# Patient Record
Sex: Female | Born: 2000 | Hispanic: No | Marital: Single | Smoking: Never smoker
Health system: Southern US, Community
[De-identification: ages and names within clinical notes are randomized; demographics above are authoritative.]

## PROBLEM LIST (undated history)

## (undated) DIAGNOSIS — Z789 Other specified health status: Secondary | ICD-10-CM

## (undated) DIAGNOSIS — G43909 Migraine, unspecified, not intractable, without status migrainosus: Secondary | ICD-10-CM

## (undated) HISTORY — DX: Other specified health status: Z78.9

## (undated) HISTORY — PX: WISDOM TOOTH EXTRACTION: SHX21

## (undated) HISTORY — DX: Migraine, unspecified, not intractable, without status migrainosus: G43.909

---
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
## 2018-02-28 ENCOUNTER — Ambulatory Visit (INDEPENDENT_AMBULATORY_CARE_PROVIDER_SITE_OTHER): Payer: Medicaid Other | Admitting: Pediatrics

## 2018-02-28 ENCOUNTER — Encounter: Payer: Self-pay | Admitting: Pediatrics

## 2018-02-28 ENCOUNTER — Other Ambulatory Visit: Payer: Self-pay

## 2018-02-28 VITALS — Temp 97.2°F | Wt 113.4 lb

## 2018-02-28 DIAGNOSIS — J029 Acute pharyngitis, unspecified: Secondary | ICD-10-CM | POA: Diagnosis not present

## 2018-02-28 LAB — POCT RAPID STREP A (OFFICE): RAPID STREP A SCREEN: NEGATIVE

## 2018-02-28 MED ORDER — ACETAMINOPHEN 325 MG PO TABS: 650.0000 mg | ORAL_TABLET | Freq: Four times a day (QID) | ORAL | 0 refills | Status: DC | PRN

## 2018-02-28 MED ORDER — IBUPROFEN 400 MG PO TABS: 400.0000 mg | ORAL_TABLET | Freq: Four times a day (QID) | ORAL | 0 refills | Status: DC | PRN

## 2018-02-28 NOTE — Patient Instructions (Signed)
It was great to see you!  Our plans for today:  - Your sore throat is likely caused by a virus. You can gargle  warm salt water to help with pain. Warm teas and soups as well as honey are also helpful in soothing the throat.  - We are collecting a strep and bacterial culture, we will call you if these are positive. - If you develop fever or worsening of symptoms, come back to be seen. - If you develop difficulty breathing, go to the ED.  Take care and seek immediate care sooner if you develop any concerns.   Dr. Linwood Dibbles   Pharyngitis Pharyngitis is a sore throat (pharynx). There is redness, pain, and swelling of your throat. Follow these instructions at home:  Drink enough fluids to keep your pee (urine) clear or pale yellow.  Only take medicine as told by your doctor. ? You may get sick again if you do not take medicine as told. Finish your medicines, even if you start to feel better. ? Do not take aspirin.  Rest.  Rinse your mouth (gargle) with salt water ( tsp of salt per 1 qt of water) every 1-2 hours. This will help the pain.  If you are not at risk for choking, you can suck on hard candy or sore throat lozenges. Contact a doctor if:  You have large, tender lumps on your neck.  You have a rash.  You cough up green, yellow-brown, or bloody spit. Get help right away if:  You have a stiff neck.  You drool or cannot swallow liquids.  You throw up (vomit) or are not able to keep medicine or liquids down.  You have very bad pain that does not go away with medicine.  You have problems breathing (not from a stuffy nose). This information is not intended to replace advice given to you by your health care provider. Make sure you discuss any questions you have with your health care provider. Document Released: 10/27/2007 Document Revised: 10/16/2015 Document Reviewed: 01/15/2013 Elsevier Interactive Patient Education  2017 [employer].

## 2018-02-28 NOTE — Progress Notes (Signed)
   Subjective:     Elizabeth Harrington, is a 17 y.o. female   History provider by patient No interpreter necessary.  Chief Complaint  Patient presents with  . Sore Throat    UTD x flu and patient defers. c/o painful sore throat x 3 days, unable to eat. unsure of fever status. using ibuprofen.   . Abdominal Pain    tummy pains and vomiting.    HPI:  Patient reports sore throat for 3 days, hurts to swallow. States she started vomiting started last night, no hematemesis. Also reports some associated abdominal pain which is now resolved. She took ibuprofen which didn't help her sore throat. No rhinorrhea, headache, rashes.  Yesterday had some ear congestion. Little sister was sick 2-3 weeks ago with viral URI. She has minimal cough. No fevers noted. Stooling and voiding normally. No concern for STIs. No difficulty breathing or drooling. Vaccinations UTD, has not yet gotten flu vaccine.  Review of Systems - per HPI   Patient's history was reviewed and updated as appropriate: current medications, past medical history and problem list.     Objective:     Temp (!) 97.2 F (36.2 C) (Temporal)   Wt 113 lb 6.4 oz (51.4 kg)   Physical Exam  Constitutional: She is oriented to person, place, and time. She appears well-developed and well-nourished. She does not appear ill.  HENT:  Right Ear: Tympanic membrane normal.  Left Ear: Tympanic membrane normal.  Mouth/Throat: Uvula is midline. Posterior oropharyngeal erythema present. No oropharyngeal exudate. No tonsillar exudate.  Neck: Normal range of motion.  Cardiovascular: Normal rate, regular rhythm and normal heart sounds.  No murmur heard. Pulmonary/Chest: Effort normal and breath sounds normal. No respiratory distress.  Abdominal: Soft. Bowel sounds are normal. There is no tenderness.  Lymphadenopathy:    She has no cervical adenopathy.  Neurological: She is alert and oriented to person, place, and time.  Skin: Skin is warm and dry.  No rash noted. No erythema.      Assessment & Plan:   Viral Pharyngitis Symptoms and exam most consistent with viral illness, especially given sick contact, lack of fever and lymphadenopathy. Rapid strep negative, will send for culture along with urine GC/CT. Will call with abnormal results - patient cell # [phone]. Supportive care and return precautions reviewed.  No follow-ups on file.  Ellwood Dense, DO  I reviewed with the resident the medical history and the resident's findings on physical examination. I discussed with the resident the patient's diagnosis and concur with the treatment plan as documented in the resident's note.  Henrietta Hoover, MD                 02/28/2018, 4:15 PM

## 2018-02-28 NOTE — Progress Notes (Signed)
Patients cell for lab results: [id number].

## 2018-03-01 LAB — C. TRACHOMATIS/N. GONORRHOEAE RNA
C. trachomatis RNA, TMA: NOT DETECTED
N. gonorrhoeae RNA, TMA: NOT DETECTED

## 2018-03-02 LAB — CULTURE, GROUP A STREP
MICRO NUMBER: 91208300
SPECIMEN QUALITY:: ADEQUATE

## 2018-07-03 ENCOUNTER — Encounter (HOSPITAL_COMMUNITY): Payer: Self-pay | Admitting: Emergency Medicine

## 2018-07-03 ENCOUNTER — Other Ambulatory Visit: Payer: Self-pay

## 2018-07-03 ENCOUNTER — Observation Stay (HOSPITAL_COMMUNITY)
Admission: EM | Admit: 2018-07-03 | Discharge: 2018-07-04 | Disposition: A | Discharge status: Home / Self Care | Payer: Medicaid Other | Attending: Internal Medicine | Admitting: Internal Medicine

## 2018-07-03 ENCOUNTER — Emergency Department (HOSPITAL_COMMUNITY): Payer: Medicaid Other

## 2018-07-03 DIAGNOSIS — R69 Illness, unspecified: Secondary | ICD-10-CM

## 2018-07-03 DIAGNOSIS — R6889 Other general symptoms and signs: Secondary | ICD-10-CM | POA: Diagnosis not present

## 2018-07-03 DIAGNOSIS — J111 Influenza due to unidentified influenza virus with other respiratory manifestations: Secondary | ICD-10-CM

## 2018-07-03 DIAGNOSIS — R000 Tachycardia, unspecified: Secondary | ICD-10-CM | POA: Diagnosis present

## 2018-07-03 DIAGNOSIS — Z791 Long term (current) use of non-steroidal anti-inflammatories (NSAID): Secondary | ICD-10-CM | POA: Insufficient documentation

## 2018-07-03 DIAGNOSIS — J101 Influenza due to other identified influenza virus with other respiratory manifestations: Secondary | ICD-10-CM | POA: Diagnosis not present

## 2018-07-03 DIAGNOSIS — R112 Nausea with vomiting, unspecified: Secondary | ICD-10-CM | POA: Insufficient documentation

## 2018-07-03 DIAGNOSIS — R05 Cough: Secondary | ICD-10-CM | POA: Diagnosis not present

## 2018-07-03 LAB — I-STAT BETA HCG BLOOD, ED (MC, WL, AP ONLY): I-stat hCG, quantitative: <5 m[IU]/mL (ref <5)

## 2018-07-03 LAB — URINALYSIS, ROUTINE W REFLEX MICROSCOPIC
Bilirubin Urine: NEGATIVE
GLUCOSE, UA: NEGATIVE mg/dL
Ketones, ur: 80 mg/dL — AB
Leukocytes, UA: NEGATIVE
Nitrite: NEGATIVE
Protein, ur: 30 mg/dL — AB
Specific Gravity, Urine: 1.017 (ref 1.005–1.030)
pH: 6 (ref 5.0–8.0)

## 2018-07-03 LAB — CBC
HCT: 47.4 % — ABNORMAL HIGH (ref 36.0–46.0)
Hemoglobin: 16.1 g/dL — ABNORMAL HIGH (ref 12.0–15.0)
MCH: 31.7 pg (ref 26.0–34.0)
MCHC: 34 g/dL (ref 30.0–36.0)
MCV: 93.3 fL (ref 80.0–100.0)
Platelets: 253 10*3/uL (ref 150–400)
RBC: 5.08 MIL/uL (ref 3.87–5.11)
RDW: 11.9 % (ref 11.5–15.5)
WBC: 8.5 10*3/uL (ref 4.0–10.5)
nRBC: 0 % (ref 0.0–0.2)

## 2018-07-03 LAB — COMPREHENSIVE METABOLIC PANEL
ALT: 20 U/L (ref 0–44)
AST: 21 U/L (ref 15–41)
Albumin: 4.1 g/dL (ref 3.5–5.0)
Alkaline Phosphatase: 63 U/L (ref 38–126)
Anion gap: 11 (ref 5–15)
BUN: 11 mg/dL (ref 6–20)
CO2: 22 mmol/L (ref 22–32)
Calcium: 8.7 mg/dL — ABNORMAL LOW (ref 8.9–10.3)
Chloride: 109 mmol/L (ref 98–111)
Creatinine, Ser: 0.77 mg/dL (ref 0.44–1.00)
GFR calc Af Amer: >60 mL/min (ref >60)
GFR calc non Af Amer: >60 mL/min (ref >60)
Glucose, Bld: 92 mg/dL (ref 70–99)
Potassium: 4.2 mmol/L (ref 3.5–5.1)
Sodium: 142 mmol/L (ref 135–145)
Total Bilirubin: 1.3 mg/dL — ABNORMAL HIGH (ref 0.3–1.2)
Total Protein: 7.3 g/dL (ref 6.5–8.1)

## 2018-07-03 LAB — RESPIRATORY PANEL BY PCR
Adenovirus: NOT DETECTED
BORDETELLA PERTUSSIS-RVPCR: NOT DETECTED
Chlamydophila pneumoniae: NOT DETECTED
Coronavirus 229E: NOT DETECTED
Coronavirus HKU1: NOT DETECTED
Coronavirus NL63: NOT DETECTED
Coronavirus OC43: NOT DETECTED
Influenza A H1 2009: DETECTED — AB
Influenza B: NOT DETECTED
METAPNEUMOVIRUS-RVPPCR: NOT DETECTED
Mycoplasma pneumoniae: NOT DETECTED
PARAINFLUENZA VIRUS 2-RVPPCR: NOT DETECTED
Parainfluenza Virus 1: NOT DETECTED
Parainfluenza Virus 3: NOT DETECTED
Parainfluenza Virus 4: NOT DETECTED
Respiratory Syncytial Virus: NOT DETECTED
Rhinovirus / Enterovirus: NOT DETECTED

## 2018-07-03 LAB — RAPID URINE DRUG SCREEN, HOSP PERFORMED
Amphetamines: NOT DETECTED
BENZODIAZEPINES: NOT DETECTED
Barbiturates: NOT DETECTED
Cocaine: NOT DETECTED
Opiates: NOT DETECTED
Tetrahydrocannabinol: NOT DETECTED

## 2018-07-03 LAB — PROCALCITONIN: Procalcitonin: <0.1 ng/mL

## 2018-07-03 LAB — INFLUENZA PANEL BY PCR (TYPE A & B)
INFLAPCR: POSITIVE — AB
Influenza B By PCR: NEGATIVE

## 2018-07-03 LAB — GROUP A STREP BY PCR: Group A Strep by PCR: NOT DETECTED

## 2018-07-03 LAB — LACTIC ACID, PLASMA: Lactic Acid, Venous: 1 mmol/L (ref 0.5–1.9)

## 2018-07-03 LAB — TSH: TSH: 0.399 u[IU]/mL (ref 0.350–4.500)

## 2018-07-03 LAB — LIPASE, BLOOD: Lipase: 28 U/L (ref 11–51)

## 2018-07-03 MED ORDER — SODIUM CHLORIDE 0.9 % IV BOLUS
1000.0000 mL | Freq: Once | INTRAVENOUS | Status: AC
  Administered 2018-07-03: 1000 mL via INTRAVENOUS

## 2018-07-03 MED ORDER — IBUPROFEN 200 MG PO TABS
400.0000 mg | ORAL_TABLET | Freq: Four times a day (QID) | ORAL | Status: DC | PRN
  Administered 2018-07-03: 400 mg via ORAL
  Filled 2018-07-03: qty 2

## 2018-07-03 MED ORDER — BENZONATATE 100 MG PO CAPS
100.0000 mg | ORAL_CAPSULE | Freq: Three times a day (TID) | ORAL | Status: DC | PRN
  Administered 2018-07-04: 100 mg via ORAL
  Filled 2018-07-03: qty 1

## 2018-07-03 MED ORDER — SUMATRIPTAN SUCCINATE 50 MG PO TABS
50.0000 mg | ORAL_TABLET | Freq: Once | ORAL | Status: AC
  Administered 2018-07-03: 50 mg via ORAL
  Filled 2018-07-03: qty 1

## 2018-07-03 MED ORDER — IBUPROFEN 400 MG PO TABS
600.0000 mg | ORAL_TABLET | Freq: Once | ORAL | Status: AC
  Administered 2018-07-03: 600 mg via ORAL
  Filled 2018-07-03: qty 1

## 2018-07-03 MED ORDER — ONDANSETRON HCL 4 MG/2ML IJ SOLN
4.0000 mg | Freq: Four times a day (QID) | INTRAMUSCULAR | Status: DC | PRN
  Administered 2018-07-03: 4 mg via INTRAVENOUS
  Filled 2018-07-03: qty 2

## 2018-07-03 MED ORDER — ENOXAPARIN SODIUM 40 MG/0.4ML ~~LOC~~ SOLN: 40.0000 mg | SUBCUTANEOUS | Status: DC

## 2018-07-03 MED ORDER — SODIUM CHLORIDE 0.9 % IV SOLN
INTRAVENOUS | Status: DC
  Administered 2018-07-03 – 2018-07-04 (×2): via INTRAVENOUS

## 2018-07-03 MED ORDER — ENOXAPARIN SODIUM 40 MG/0.4ML ~~LOC~~ SOLN
40.0000 mg | SUBCUTANEOUS | Status: DC
  Filled 2018-07-03: qty 0.4

## 2018-07-03 MED ORDER — OSELTAMIVIR PHOSPHATE 75 MG PO CAPS
75.0000 mg | ORAL_CAPSULE | Freq: Two times a day (BID) | ORAL | Status: DC
  Administered 2018-07-03 – 2018-07-04 (×2): 75 mg via ORAL
  Filled 2018-07-03 (×2): qty 1

## 2018-07-03 MED ORDER — OSELTAMIVIR PHOSPHATE 75 MG PO CAPS
75.0000 mg | ORAL_CAPSULE | Freq: Once | ORAL | Status: AC
  Administered 2018-07-03: 75 mg via ORAL
  Filled 2018-07-03: qty 1

## 2018-07-03 MED ORDER — FLUTICASONE PROPIONATE 50 MCG/ACT NA SUSP
2.0000 | Freq: Every day | NASAL | Status: DC
  Administered 2018-07-04: 2 via NASAL
  Filled 2018-07-03: qty 16

## 2018-07-03 MED ORDER — ONDANSETRON HCL 4 MG/2ML IJ SOLN
4.0000 mg | Freq: Once | INTRAMUSCULAR | Status: AC | PRN
  Administered 2018-07-03: 4 mg via INTRAVENOUS
  Filled 2018-07-03: qty 2

## 2018-07-03 MED ORDER — RAMELTEON 8 MG PO TABS
8.0000 mg | ORAL_TABLET | Freq: Once | ORAL | Status: AC
  Administered 2018-07-03: 8 mg via ORAL
  Filled 2018-07-03: qty 1

## 2018-07-03 MED ORDER — GUAIFENESIN ER 600 MG PO TB12
600.0000 mg | ORAL_TABLET | Freq: Two times a day (BID) | ORAL | Status: DC
  Administered 2018-07-04: 600 mg via ORAL
  Filled 2018-07-03 (×2): qty 1

## 2018-07-03 MED ORDER — ACETAMINOPHEN 325 MG PO TABS
650.0000 mg | ORAL_TABLET | Freq: Four times a day (QID) | ORAL | Status: DC | PRN
  Administered 2018-07-03 – 2018-07-04 (×2): 650 mg via ORAL
  Filled 2018-07-03 (×2): qty 2

## 2018-07-03 NOTE — ED Notes (Signed)
Report attempted 

## 2018-07-03 NOTE — H&P (Addendum)
History and Physical  Elizabeth Harrington FSE:[id number] DOB: 03/11/2001 DOA: 07/03/2018  Referring physician: EDP PCP: Jonetta Osgood, MD   Chief Complaint: Flulike symptoms, cough, nausea, vomiting  HPI: Elizabeth Harrington is a 18 y.o. female  No prior medical history presented to the emergency room with above complaints.Patient reports sore throat, nasal congestion, dry cough x 2 days and nonbilious, nonbloody emesis x10 times.she has not been able to keep anything down for the last two days. She vomited twice today, last when she arrived to the ED. She denies sick contact, she reports similar symptom when she had her last period. She denies chest pain,  ab pain, no flank pain, no dysuria. No diarrhea.  ED course: She has mild temperature 100.1 on presentation, she has persistent sinus tachycardia and borderline bp after 2 L's of normal saline bolus. Blood pressure stable, no hypoxia.  Chest x-ray no acute findings, strep throat negative. Pregnancy test negative,  WBC 8.5, creatinine 0.77, UA + blood ( currently having period),  flu test pending collection, she is given one dose of tamiflu, due to persistent tachycardia, not able to tolerate oral intake, hospitalist called to further manage the patient.   Review of Systems:  Detail per HPI, Review of systems are otherwise negative  History reviewed. No pertinent past medical history. Past Surgical History:  Procedure Laterality Date  . WISDOM TOOTH EXTRACTION     Social History:  reports that she has never smoked. She has never used smokeless tobacco. She reports that she does not drink alcohol or use drugs. Patient lives at home & is able to participate in activities of daily living independently   No Known Allergies  No family history on file.    Prior to Admission medications   Medication Sig Start Date End Date Taking? Authorizing Provider  ibuprofen (ADVIL,MOTRIN) 400 MG tablet Take 1 tablet (400 mg total) by mouth every  6 (six) hours as needed. Patient taking differently: Take 600 mg by mouth every 6 (six) hours as needed.  02/28/18  Yes Rumball, Jill Side, DO  OVER THE COUNTER MEDICATION Take 20 mLs by mouth as needed (coug). Robitussin max strength cough and chest congestion DM   Yes [provider]  UNABLE TO FIND Take 2 tablets by mouth once. Med Name:Contac.   Yes [provider]  acetaminophen (TYLENOL) 325 MG tablet Take 2 tablets (650 mg total) by mouth every 6 (six) hours as needed for mild pain or fever. Patient not taking: Reported on 07/03/2018 02/28/18   Ellwood Dense, DO    Physical Exam: BP (!) 92/40   Pulse (!) 132   Temp 99.4 F (37.4 C) (Oral)   Resp 19   Ht 4\' 11"  (1.499 m)   Wt 49.9 kg   LMP 07/03/2018   SpO2 100%   BMI 22.22 kg/m   General:  NAD Eyes: PERRL ENT: unremarkable Neck: supple, no JVD Cardiovascular: tachycardia Respiratory: CTABL Abdomen: soft/ND/ND, positive bowel sounds Skin: no rash Musculoskeletal:  No edema Psychiatric: calm/cooperative Neurologic: no focal findings            Labs on Admission:  Basic Metabolic Panel: Recent Labs  Lab 07/03/18 1023  NA 142  K 4.2  CL 109  CO2 22  GLUCOSE 92  BUN 11  CREATININE 0.77  CALCIUM 8.7*   Liver Function Tests: Recent Labs  Lab 07/03/18 1023  AST 21  ALT 20  ALKPHOS 63  BILITOT 1.3*  PROT 7.3  ALBUMIN 4.1   Recent Labs  Lab 07/03/18 1023  LIPASE 28   No results for input(s): AMMONIA in the last 168 hours. CBC: Recent Labs  Lab 07/03/18 0949  WBC 8.5  HGB 16.1*  HCT 47.4*  MCV 93.3  PLT 253   Cardiac Enzymes: No results for input(s): CKTOTAL, CKMB, CKMBINDEX, TROPONINI in the last 168 hours.  BNP (last 3 results) No results for input(s): BNP in the last 8760 hours.  ProBNP (last 3 results) No results for input(s): PROBNP in the last 8760 hours.  CBG: No results for input(s): GLUCAP in the last 168 hours.  Radiological Exams on Admission: Dg Chest 2  View  Result Date: 07/03/2018 CLINICAL DATA:  Cough and emesis EXAM: CHEST - 2 VIEW COMPARISON:  October 31, 2016 FINDINGS: The lungs are clear. The heart size and pulmonary vascularity are normal. No adenopathy. No bone lesions. IMPRESSION: No edema or consolidation. Electronically Signed   By: Bretta BangWilliam  Woodruff III M.D.   On: 07/03/2018 11:57      Assessment/Plan Present on Admission: . Flu-like symptoms   Flu like symptom with significant dehydration, tachycardia, n/v -flu swab pending, will add on HIV, blood culture, procalcitonin, lactic acid -continue hydration, supportive care, full liquid diet, advance diet as tolerated  DVT prophylaxis: lovenox  Consultants: none  Code Status: full   Family Communication:  Patient   Disposition Plan: med tele obs, likely home tomorrow  Time spent: 50mins  Albertine GratesFang Airiel Oblinger MD, PhD [facility] Pager [phone] If 7PM-7AM, please contact night-coverage at [email or web address], password [email or web address]

## 2018-07-03 NOTE — ED Notes (Signed)
Pt oob to bathroom with steady gait. 

## 2018-07-03 NOTE — ED Triage Notes (Signed)
Patient reports sore throat x 2 days and emesis onset of yesterday. Patient reports dry cough x 2 days.

## 2018-07-03 NOTE — ED Notes (Signed)
Pt given po fluids and tolerating well. 

## 2018-07-03 NOTE — ED Notes (Signed)
Dr. Jolaine Artist aware of hr.

## 2018-07-03 NOTE — ED Provider Notes (Signed)
[name]   CSN: [id number] Arrival date & time: 07/03/18  [id number]     History   Chief Complaint Chief Complaint  Patient presents with  . Sore Throat  . Vomiting    HPI Elizabeth Harrington is a 18 y.o. female.  Patient is a 18 year old female who presents with a 2-day history of URI symptoms with runny nose congestion and coughing.  She reports her cough is mostly dry.  She also has a sore throat.  She denies any rash.  She has had some associated nausea and vomiting and today has not been able to keep anything down.  She is been trying to use some over-the-counter medicines including Robitussin for the coughing but has not been able keep it down today.  She was noted to be markedly tachycardic in triage.     History reviewed. No pertinent past medical history.  Patient Active Problem List   Diagnosis Date Noted  . Adjustment disorder with depressed mood 11/13/2015    Past Surgical History:  Procedure Laterality Date  . WISDOM TOOTH EXTRACTION       OB History    Gravida  0   Para  0   Term  0   Preterm  0   AB  0   Living        SAB  0   TAB  0   Ectopic  0   Multiple      Live Births               Home Medications    Prior to Admission medications   Medication Sig Start Date End Date Taking? Authorizing Provider  ibuprofen (ADVIL,MOTRIN) 400 MG tablet Take 1 tablet (400 mg total) by mouth every 6 (six) hours as needed. Patient taking differently: Take 600 mg by mouth every 6 (six) hours as needed.  02/28/18  Yes Rumball, Jill SideAlison, DO  OVER THE COUNTER MEDICATION Take 20 mLs by mouth as needed (coug). Robitussin max strength cough and chest congestion DM   Yes [provider]  UNABLE TO FIND Take 2 tablets by mouth once. Med Name:Contac.   Yes [provider]  acetaminophen (TYLENOL) 325 MG tablet Take 2 tablets (650 mg total) by mouth every 6 (six) hours as needed for  mild pain or fever. Patient not taking: Reported on 07/03/2018 02/28/18   Ellwood Denseumball, Alison, DO    Family History No family history on file.  Social History Social History   Tobacco Use  . Smoking status: Never Smoker  . Smokeless tobacco: Never Used  Substance Use Topics  . Alcohol use: No    Alcohol/week: 0.0 standard drinks  . Drug use: Never     Allergies   Patient has no known allergies.   Review of Systems Review of Systems  Constitutional: Positive for appetite change, fatigue and fever. Negative for chills and diaphoresis.  HENT: Positive for congestion, postnasal drip, rhinorrhea, sore throat and trouble swallowing. Negative for sneezing and voice change.   Eyes: Negative.   Respiratory: Positive for cough. Negative for chest tightness and shortness of breath.   Cardiovascular: Negative for chest pain and leg swelling.  Gastrointestinal: Positive for nausea and vomiting. Negative for abdominal pain, blood in stool and diarrhea.  Genitourinary: Negative for difficulty urinating, flank pain, frequency and hematuria.  Musculoskeletal: Negative for arthralgias and back pain.  Skin: Negative for rash.  Neurological: Negative for dizziness, speech difficulty, weakness, numbness and headaches.  Physical Exam Updated Vital Signs BP (!) 92/40   Pulse (!) 132   Temp 99.4 F (37.4 C) (Oral)   Resp 19   Ht 4\' 11"  (1.499 m)   Wt 49.9 kg   LMP 07/03/2018   SpO2 100%   BMI 22.22 kg/m   Physical Exam Constitutional:      Appearance: She is well-developed.  HENT:     Head: Normocephalic and atraumatic.     Right Ear: Tympanic membrane normal.     Left Ear: Tympanic membrane normal.     Nose: No congestion or rhinorrhea.     Mouth/Throat:     Mouth: No oral lesions.     Pharynx: Uvula midline. Posterior oropharyngeal erythema present. No pharyngeal swelling, oropharyngeal exudate or uvula swelling.     Tonsils: Tonsillar abscess present. No tonsillar exudate.      Comments: Mild erythema posterior pharynx without exudates, uvula is midline, no elevation of the tongue Eyes:     Pupils: Pupils are equal, round, and reactive to light.  Neck:     Musculoskeletal: Normal range of motion and neck supple.  Cardiovascular:     Rate and Rhythm: Normal rate and regular rhythm.     Heart sounds: Normal heart sounds.  Pulmonary:     Effort: Pulmonary effort is normal. No respiratory distress.     Breath sounds: Normal breath sounds. No wheezing or rales.  Chest:     Chest wall: No tenderness.  Abdominal:     General: Bowel sounds are normal.     Palpations: Abdomen is soft.     Tenderness: There is no abdominal tenderness. There is no guarding or rebound.  Musculoskeletal: Normal range of motion.  Lymphadenopathy:     Cervical: No cervical adenopathy.  Skin:    General: Skin is warm and dry.     Findings: No rash.  Neurological:     Mental Status: She is alert and oriented to person, place, and time.      ED Treatments / Results  Labs (all labs ordered are listed, but only abnormal results are displayed) Labs Reviewed  CBC - Abnormal; Notable for the following components:      Result Value   Hemoglobin 16.1 (*)    HCT 47.4 (*)    All other components within normal limits  URINALYSIS, ROUTINE W REFLEX MICROSCOPIC - Abnormal; Notable for the following components:   APPearance HAZY (*)    Hgb urine dipstick LARGE (*)    Ketones, ur 80 (*)    Protein, ur 30 (*)    Bacteria, UA RARE (*)    All other components within normal limits  COMPREHENSIVE METABOLIC PANEL - Abnormal; Notable for the following components:   Calcium 8.7 (*)    Total Bilirubin 1.3 (*)    All other components within normal limits  GROUP A STREP BY PCR  LIPASE, BLOOD  INFLUENZA PANEL BY PCR (TYPE A & B)  I-STAT BETA HCG BLOOD, ED ([facility], [facility], [facility] ONLY)    EKG None  Radiology Dg Chest 2 View  Result Date: 07/03/2018 CLINICAL DATA:  Cough and emesis EXAM: CHEST - 2 VIEW  COMPARISON:  October 31, 2016 FINDINGS: The lungs are clear. The heart size and pulmonary vascularity are normal. No adenopathy. No bone lesions. IMPRESSION: No edema or consolidation. Electronically Signed   By: Bretta Bang III M.D.   On: 07/03/2018 11:57    Procedures Procedures (including critical care time)  Medications Ordered in ED Medications  oseltamivir (TAMIFLU) capsule 75 mg (has no administration in time range)  ondansetron (ZOFRAN) injection 4 mg (4 mg Intravenous Given 07/03/18 0949)  sodium chloride 0.9 % bolus 1,000 mL (0 mLs Intravenous Stopped 07/03/18 1359)  ibuprofen (ADVIL,MOTRIN) tablet 600 mg (600 mg Oral Given 07/03/18 1405)  sodium chloride 0.9 % bolus 1,000 mL (1,000 mLs Intravenous New Bag/Given 07/03/18 1405)     Initial Impression / Assessment and Plan / ED Course  I have reviewed the triage vital signs and the nursing notes.  Pertinent labs & imaging results that were available during my care of the patient were reviewed by me and considered in my medical decision making (see chart for details).     Patient is a 18 year old female who presents with flulike symptoms associated with vomiting.  Her chest x-ray does not show evidence of pneumonia.  Her urine does not appear infected.  She has some hematuria but she is currently on her menstrual cycle.  Her other labs are non-concerning.  However she is persistently tachycardic.  She has a heart rate in the 130s and 140s.  She was given 3 L IV fluid and fever medication as her temperature did go up to 101.  Even after that she is still tachycardic.  I spoke with Dr. Roda ShuttersXU with the hospitalist service who will admit the patient.  Influenza test is pending but she was started on Tamiflu.  Final Clinical Impressions(s) / ED Diagnoses   Final diagnoses:  Influenza-like illness  Tachycardia    ED Discharge Orders    None       Rolan BuccoBelfi, Selwyn Reason, MD 07/03/18 1544

## 2018-07-03 NOTE — ED Notes (Signed)
Patient transported to X-ray 

## 2018-07-04 DIAGNOSIS — R6889 Other general symptoms and signs: Secondary | ICD-10-CM | POA: Diagnosis not present

## 2018-07-04 LAB — CBC
HCT: 37.9 % (ref 36.0–46.0)
Hemoglobin: 12.3 g/dL (ref 12.0–15.0)
MCH: 30.8 pg (ref 26.0–34.0)
MCHC: 32.5 g/dL (ref 30.0–36.0)
MCV: 95 fL (ref 80.0–100.0)
NRBC: 0 % (ref 0.0–0.2)
Platelets: 174 10*3/uL (ref 150–400)
RBC: 3.99 MIL/uL (ref 3.87–5.11)
RDW: 11.9 % (ref 11.5–15.5)
WBC: 4.7 10*3/uL (ref 4.0–10.5)

## 2018-07-04 LAB — COMPREHENSIVE METABOLIC PANEL
ALT: 16 U/L (ref 0–44)
AST: 18 U/L (ref 15–41)
Albumin: 3.4 g/dL — ABNORMAL LOW (ref 3.5–5.0)
Alkaline Phosphatase: 50 U/L (ref 38–126)
Anion gap: 9 (ref 5–15)
BUN: <5 mg/dL — ABNORMAL LOW (ref 6–20)
CALCIUM: 8.4 mg/dL — AB (ref 8.9–10.3)
CO2: 18 mmol/L — ABNORMAL LOW (ref 22–32)
Chloride: 113 mmol/L — ABNORMAL HIGH (ref 98–111)
Creatinine, Ser: 0.59 mg/dL (ref 0.44–1.00)
GFR calc Af Amer: >60 mL/min (ref >60)
GFR calc non Af Amer: >60 mL/min (ref >60)
Glucose, Bld: 92 mg/dL (ref 70–99)
Potassium: 3.4 mmol/L — ABNORMAL LOW (ref 3.5–5.1)
Sodium: 140 mmol/L (ref 135–145)
Total Bilirubin: 0.9 mg/dL (ref 0.3–1.2)
Total Protein: 6.5 g/dL (ref 6.5–8.1)

## 2018-07-04 LAB — PROCALCITONIN: Procalcitonin: <0.1 ng/mL

## 2018-07-04 LAB — HIV ANTIBODY (ROUTINE TESTING W REFLEX): HIV Screen 4th Generation wRfx: NONREACTIVE

## 2018-07-04 MED ORDER — GUAIFENESIN ER 600 MG PO TB12: 600.0000 mg | ORAL_TABLET | Freq: Two times a day (BID) | ORAL | 0 refills | Status: DC

## 2018-07-04 MED ORDER — PHENOL 1.4 % MT LIQD
1.0000 | OROMUCOSAL | Status: DC | PRN
  Administered 2018-07-04: 1 via OROMUCOSAL
  Filled 2018-07-04: qty 177

## 2018-07-04 MED ORDER — NAPROXEN 500 MG PO TABS: 500.0000 mg | ORAL_TABLET | Freq: Two times a day (BID) | ORAL | 0 refills | Status: AC | PRN

## 2018-07-04 MED ORDER — ONDANSETRON HCL 4 MG PO TABS: 4.0000 mg | ORAL_TABLET | Freq: Every day | ORAL | 1 refills | Status: AC | PRN

## 2018-07-04 MED ORDER — OSELTAMIVIR PHOSPHATE 75 MG PO CAPS: 75.0000 mg | ORAL_CAPSULE | Freq: Two times a day (BID) | ORAL | 0 refills | Status: DC

## 2018-07-04 NOTE — Discharge Summary (Signed)
Physician Discharge Summary  Elizabeth Harrington WUJ:[id number] DOB: [birth year] DOA: 07/03/2018  PCP: Jonetta Osgood, MD  Admit date: 07/03/2018 Discharge date: 07/04/2018  Admitted From: Home Disposition: Home  Recommendations for Outpatient Follow-up:  1. Follow up with PCP in 1-2 weeks 2. Please obtain BMP/CBC in one week 3. Please take your Tamiflu as prescribed.  Home Health: None Equipment/Devices: None  Discharge Condition: Stable CODE STATUS: Full Diet recommendation: Clear liquid diet, advance as tolerated  Brief/Interim Summary:  #) Influenza A infection: Patient was admitted with malaise, myalgias, cough, nausea, vomiting and was found to have significant tachycardia.  Patient had significantly decreased p.o. intake.  Patient tested positive for flu A.  Patient was started on oseltamivir and given significant IV fluids.  Patient's tachycardia significantly improved.  Patient tolerated liquids quite well.  Patient was discharged home with Tamiflu and instructions to continue to push fluids and advance diet as tolerated as an outpatient.  Discharge Diagnoses:  Active Problems:   Flu-like symptoms    Discharge Instructions  Discharge Instructions    Call MD for:  difficulty breathing, headache or visual disturbances   Complete by:  As directed    Call MD for:  extreme fatigue   Complete by:  As directed    Call MD for:  hives   Complete by:  As directed    Call MD for:  persistant dizziness or light-headedness   Complete by:  As directed    Call MD for:  persistant nausea and vomiting   Complete by:  As directed    Call MD for:  redness, tenderness, or signs of infection (pain, swelling, redness, odor or green/yellow discharge around incision site)   Complete by:  As directed    Call MD for:  severe uncontrolled pain   Complete by:  As directed    Call MD for:  temperature >100.4   Complete by:  As directed    Diet - low sodium heart healthy   Complete by:  As  directed    Discharge instructions   Complete by:  As directed    Please follow-up with your primary care doctor in 1 week.  Please take your Tamiflu as prescribed.   Increase activity slowly   Complete by:  As directed      Allergies as of 07/04/2018   No Known Allergies     Medication List    STOP taking these medications   acetaminophen 325 MG tablet Commonly known as:  TYLENOL   ibuprofen 400 MG tablet Commonly known as:  ADVIL,MOTRIN   OVER THE COUNTER MEDICATION   UNABLE TO FIND     TAKE these medications   guaiFENesin 600 MG 12 hr tablet Commonly known as:  MUCINEX Take 1 tablet (600 mg total) by mouth 2 (two) times daily.   naproxen 500 MG tablet Commonly known as:  NAPROSYN Take 1 tablet (500 mg total) by mouth 2 (two) times daily as needed for headache.   ondansetron 4 MG tablet Commonly known as:  ZOFRAN Take 1 tablet (4 mg total) by mouth daily as needed for nausea or vomiting.   oseltamivir 75 MG capsule Commonly known as:  TAMIFLU Take 1 capsule (75 mg total) by mouth 2 (two) times daily.       No Known Allergies  Consultations:  None   Procedures/Studies: Dg Chest 2 View  Result Date: 07/03/2018 CLINICAL DATA:  Cough and emesis EXAM: CHEST - 2 VIEW COMPARISON:  October 31, 2016 FINDINGS: The lungs  are clear. The heart size and pulmonary vascularity are normal. No adenopathy. No bone lesions. IMPRESSION: No edema or consolidation. Electronically Signed   By: Bretta BangWilliam  Woodruff III M.D.   On: 07/03/2018 11:57      Subjective:   Discharge Exam: Vitals:   07/03/18 2317 07/04/18 0730  BP: (!) 103/54 (!) 106/58  Pulse: (!) 113 93  Resp: 18 14  Temp: 99.4 F (37.4 C) 98.1 F (36.7 C)  SpO2: 98% 99%   Vitals:   07/03/18 1645 07/03/18 1846 07/03/18 2317 07/04/18 0730  BP: (!) 99/52 (!) 99/53 (!) 103/54 (!) 106/58  Pulse: (!) 120 (!) 119 (!) 113 93  Resp: 16 16 18 14   Temp:  99.2 F (37.3 C) 99.4 F (37.4 C) 98.1 F (36.7 C)   TempSrc:  Oral Oral Oral  SpO2: 99% 100% 98% 99%  Weight:      Height:        General: Pt is alert, awake, not in acute distress Cardiovascular: RRR, S1/S2 +, no rubs, no gallops Respiratory: CTA bilaterally, no wheezing, no rhonchi Abdominal: Soft, NT, ND, bowel sounds + Extremities: no edema    The results of significant diagnostics from this hospitalization (including imaging, microbiology, ancillary and laboratory) are listed below for reference.     Microbiology: Recent Results (from the past 240 hour(s))  Group A Strep by PCR     Status: None   Collection Time: 07/03/18 11:12 AM  Result Value Ref Range Status   Group A Strep by PCR NOT DETECTED NOT DETECTED Final    Comment: Performed at [facility], [address] [place], [place] [address]  Respiratory Panel by PCR     Status: Abnormal   Collection Time: 07/03/18  3:02 PM  Result Value Ref Range Status   Adenovirus NOT DETECTED NOT DETECTED Final   Coronavirus 229E NOT DETECTED NOT DETECTED Final    Comment: (NOTE) The Coronavirus on the Respiratory Panel, DOES NOT test for the novel  Coronavirus (2019 nCoV)    Coronavirus HKU1 NOT DETECTED NOT DETECTED Final   Coronavirus NL63 NOT DETECTED NOT DETECTED Final   Coronavirus OC43 NOT DETECTED NOT DETECTED Final   Metapneumovirus NOT DETECTED NOT DETECTED Final   Rhinovirus / Enterovirus NOT DETECTED NOT DETECTED Final   Influenza A H1 2009 DETECTED (A) NOT DETECTED Final   Influenza B NOT DETECTED NOT DETECTED Final   Parainfluenza Virus 1 NOT DETECTED NOT DETECTED Final   Parainfluenza Virus 2 NOT DETECTED NOT DETECTED Final   Parainfluenza Virus 3 NOT DETECTED NOT DETECTED Final   Parainfluenza Virus 4 NOT DETECTED NOT DETECTED Final   Respiratory Syncytial Virus NOT DETECTED NOT DETECTED Final   Bordetella pertussis NOT DETECTED NOT DETECTED Final   Chlamydophila pneumoniae NOT DETECTED NOT DETECTED Final   Mycoplasma pneumoniae NOT DETECTED NOT  DETECTED Final    Comment: Performed at [facility], [address] [place], [place] [address]  Culture, blood (routine x 2)     Status: None (Preliminary result)   Collection Time: 07/03/18  5:23 PM  Result Value Ref Range Status   Specimen Description BLOOD LEFT ANTECUBITAL  Final   Special Requests   Final    BOTTLES DRAWN AEROBIC AND ANAEROBIC Blood Culture adequate volume   Culture   Final    NO GROWTH < 12 HOURS Performed at [facility], [address] [place], [place] [address]    Report Status PENDING  Incomplete  Culture, blood (routine x 2)     Status: None (Preliminary result)   Collection Time: 07/03/18  5:32 PM  Result Value Ref Range Status   Specimen Description BLOOD LEFT ANTECUBITAL  Final   Special Requests   Final    BOTTLES DRAWN AEROBIC AND ANAEROBIC Blood Culture adequate volume   Culture   Final    NO GROWTH < 12 HOURS Performed at [facility], [address], [place], [place] [address]    Report Status PENDING  Incomplete     Labs: BNP (last 3 results) No results for input(s): BNP in the last 8760 hours. Basic Metabolic Panel: Recent Labs  Lab 07/03/18 1023 07/04/18 0252  NA 142 140  K 4.2 3.4*  CL 109 113*  CO2 22 18*  GLUCOSE 92 92  BUN 11 <5*  CREATININE 0.77 0.59  CALCIUM 8.7* 8.4*   Liver Function Tests: Recent Labs  Lab 07/03/18 1023 07/04/18 0252  AST 21 18  ALT 20 16  ALKPHOS 63 50  BILITOT 1.3* 0.9  PROT 7.3 6.5  ALBUMIN 4.1 3.4*   Recent Labs  Lab 07/03/18 1023  LIPASE 28   No results for input(s): AMMONIA in the last 168 hours. CBC: Recent Labs  Lab 07/03/18 0949 07/04/18 0252  WBC 8.5 4.7  HGB 16.1* 12.3  HCT 47.4* 37.9  MCV 93.3 95.0  PLT 253 174   Cardiac Enzymes: No results for input(s): CKTOTAL, CKMB, CKMBINDEX, TROPONINI in the last 168 hours. BNP: Invalid input(s): POCBNP CBG: No results for input(s): GLUCAP in the last 168 hours. D-Dimer No results for input(s): DDIMER in  the last 72 hours. Hgb A1c No results for input(s): HGBA1C in the last 72 hours. Lipid Profile No results for input(s): CHOL, HDL, LDLCALC, TRIG, CHOLHDL, LDLDIRECT in the last 72 hours. Thyroid function studies Recent Labs    07/03/18 1730  TSH 0.399   Anemia work up No results for input(s): VITAMINB12, FOLATE, FERRITIN, TIBC, IRON, RETICCTPCT in the last 72 hours. Urinalysis    Component Value Date/Time   COLORURINE YELLOW 07/03/2018 1327   APPEARANCEUR HAZY (A) 07/03/2018 1327   LABSPEC 1.017 07/03/2018 1327   PHURINE 6.0 07/03/2018 1327   GLUCOSEU NEGATIVE 07/03/2018 1327   HGBUR LARGE (A) 07/03/2018 1327   BILIRUBINUR NEGATIVE 07/03/2018 1327   BILIRUBINUR negative 01/25/2017 1653   KETONESUR 80 (A) 07/03/2018 1327   PROTEINUR 30 (A) 07/03/2018 1327   UROBILINOGEN 0.2 01/25/2017 1653   UROBILINOGEN 0.2 07/21/2014 1155   NITRITE NEGATIVE 07/03/2018 1327   LEUKOCYTESUR NEGATIVE 07/03/2018 1327   Sepsis Labs Invalid input(s): PROCALCITONIN,  WBC,  LACTICIDVEN Microbiology Recent Results (from the past 240 hour(s))  Group A Strep by PCR     Status: None   Collection Time: 07/03/18 11:12 AM  Result Value Ref Range Status   Group A Strep by PCR NOT DETECTED NOT DETECTED Final    Comment: Performed at [facility], [address], [place], [place] [address]  Respiratory Panel by PCR     Status: Abnormal   Collection Time: 07/03/18  3:02 PM  Result Value Ref Range Status   Adenovirus NOT DETECTED NOT DETECTED Final   Coronavirus 229E NOT DETECTED NOT DETECTED Final    Comment: (NOTE) The Coronavirus on the Respiratory Panel, DOES NOT test for the novel  Coronavirus (2019 nCoV)    Coronavirus HKU1 NOT DETECTED NOT DETECTED Final   Coronavirus NL63 NOT DETECTED NOT DETECTED Final   Coronavirus  OC43 NOT DETECTED NOT DETECTED Final   Metapneumovirus NOT DETECTED NOT DETECTED Final   Rhinovirus / Enterovirus NOT DETECTED NOT DETECTED Final   Influenza A H1 2009  DETECTED (A) NOT DETECTED Final   Influenza B NOT DETECTED NOT DETECTED Final   Parainfluenza Virus 1 NOT DETECTED NOT DETECTED Final   Parainfluenza Virus 2 NOT DETECTED NOT DETECTED Final   Parainfluenza Virus 3 NOT DETECTED NOT DETECTED Final   Parainfluenza Virus 4 NOT DETECTED NOT DETECTED Final   Respiratory Syncytial Virus NOT DETECTED NOT DETECTED Final   Bordetella pertussis NOT DETECTED NOT DETECTED Final   Chlamydophila pneumoniae NOT DETECTED NOT DETECTED Final   Mycoplasma pneumoniae NOT DETECTED NOT DETECTED Final    Comment: Performed at [facility], [address], [place], [place] [address]  Culture, blood (routine x 2)     Status: None (Preliminary result)   Collection Time: 07/03/18  5:23 PM  Result Value Ref Range Status   Specimen Description BLOOD LEFT ANTECUBITAL  Final   Special Requests   Final    BOTTLES DRAWN AEROBIC AND ANAEROBIC Blood Culture adequate volume   Culture   Final    NO GROWTH < 12 HOURS Performed at [facility], [address], [place], [place] [address]    Report Status PENDING  Incomplete  Culture, blood (routine x 2)     Status: None (Preliminary result)   Collection Time: 07/03/18  5:32 PM  Result Value Ref Range Status   Specimen Description BLOOD LEFT ANTECUBITAL  Final   Special Requests   Final    BOTTLES DRAWN AEROBIC AND ANAEROBIC Blood Culture adequate volume   Culture   Final    NO GROWTH < 12 HOURS Performed at [facility], [address], [place], [place] [address]    Report Status PENDING  Incomplete     Time coordinating discharge: 35  SIGNED:   Delaine Lame, MD  [facility] 07/04/2018, 8:59 AM  If 7PM-7AM, please contact night-coverage [email or web address] Password [email or web address]

## 2018-07-04 NOTE — Discharge Instructions (Signed)
Gripe en los adultos Influenza, Adult La gripe, tambin llamada "influenza", es una infeccin viral que afecta, principalmente, las vas respiratorias. Las vas respiratorias incluyen rganos que ayudan a respirar, como los pulmones, la nariz y la garganta. La gripe provoca muchos sntomas similares a los del resfro comn, junto con fiebre alta y dolor corporal. Se transmite fcilmente de persona a persona (es contagiosa). La mejor manera de prevenir la gripe es aplicndose la vacuna contra la gripe todos los aos. Cules son las causas? La causa de esta afeccin es el virus de la influenza. Puede contraer el virus de las siguientes maneras:  Al inhalar las gotitas que estn en el aire liberadas por la tos o el estornudo de una persona infectada.  Al tocar algo que estuvo expuesto al virus (fue contaminado) y luego tocarse la boca, nariz u ojos. Qu incrementa el riesgo? Los siguientes factores pueden hacer que usted sea propenso a contraer la gripe:  No lavarse o desinfectarse las manos con frecuencia.  Tener contacto cercano con muchas personas durante la temporada de resfro y gripe.  Tocarse la boca, los ojos o la nariz sin antes lavarse ni desinfectarse las manos.  No colocarse la vacuna anual contra la gripe. Puede correr un mayor riesgo de tener gripe, incluso problemas graves como una infeccin pulmonar (neumona), si:  Es mayor de 65 aos de edad.  Est embarazada.  Tiene debilitado el sistema que combate las enfermedades (sistema inmunitario). Puede tener un sistema inmunitario debilitado si: ? Tienen VIH o sndrome de inmunodeficiencia adquirida (SIDA). ? Est recibiendo quimioterapia. ? Usa medicamentos que reducen (suprimen) la actividad de su sistema inmunitario.  Tiene una enfermedad a largo plazo (crnica), como una enfermedad cardaca, enfermedad renal, diabetes o enfermedad pulmonar.  Tiene un trastorno heptico.  Tiene mucho sobrepeso (obesidad  mrbida).  Tiene anemia. Esta es una afeccin que afecta a los glbulos rojos.  Tiene asma. Cules son los signos o los sntomas? Los sntomas de esta afeccin por lo general comienzan de repente y duran entre 4 y 14das. Pueden incluir los siguientes:  Fiebre y escalofros.  Dolores de cabeza, dolores en el cuerpo o dolores musculares.  Dolor de garganta.  Tos.  Secrecin o congestin nasal.  Malestar en el pecho.  Falta de apetito.  Debilidad o fatiga.  Mareos.  Nuseas o vmitos. Cmo se diagnostica? Esta afeccin se puede diagnosticar en funcin de lo siguiente:  Los sntomas y los antecedentes mdicos.  Un examen fsico.  Un hisopado de nariz o garganta y el anlisis del lquido extrado para detectar el virus de la gripe. Cmo se trata? Si la gripe se diagnostica de forma temprana, puede tratarse con medicamentos que pueden ayudar a reducir la gravedad de la enfermedad y reducir su duracin (medicamentos antivirales). Estos pueden administrarse por boca (va oral) o por va intravenosa. Cuidarse en su hogar tambin puede ayudar a aliviar los sntomas. El mdico puede recomendarle lo siguiente:  Tomar medicamentos de venta libre.  Beber mucho lquido. En muchos casos, la gripe desaparece sola. Si tiene sntomas graves o complicaciones, puede tratarse en un hospital. Siga estas indicaciones en su casa: Actividad  Descanse segn sea necesario y duerma bien.  Qudese en su casa y no concurra al trabajo o a la escuela como se lo haya indicado su mdico. A menos que visite al mdico, evite salir de su casa hasta que la fiebre haya desaparecido por 24 horas sin tomar medicamentos. Comida y bebida  Tome una solucin de rehidratacin oral (  oral rehydration solution, ORS). Esta es una bebida que se vende en farmacias y tiendas minoristas.  Beba suficiente lquido como para mantener la orina de color amarillo plido.  En la medida en que pueda, beba lquidos  claros en pequeas cantidades. Beba lquidos claros, como agua, cubitos de hielo, jugos de fruta diluidos y bebidas deportivas bajas en caloras.  En la medida en que pueda, consuma alimentos blandos y fciles de digerir en pequeas cantidades. Estos alimentos incluyen bananas, compota de manzana, arroz, carnes magras, tostadas y galletas saladas.  Evite consumir lquidos que contengan mucha azcar o cafena, como bebidas energticas, bebidas deportivas comunes y refrescos.  Evite tomar alcohol.  Evite los alimentos condimentados o con alto contenido de grasa. Indicaciones generales      Tome los medicamentos de venta libre y los recetados solamente como se lo haya indicado el mdico.  Use un humidificador de aire fro para agregar humedad al aire de su casa. Esto puede facilitar la respiracin.  Al toser o estornudar, cbrase la boca y la nariz.  Lvese las manos con agua y jabn frecuentemente, en especial despus de toser o estornudar. Use desinfectante para manos con alcohol si no dispone de agua y jabn.  Concurra a todas las visitas de control como se lo haya indicado el mdico. Esto es importante. Cmo se evita?   Colquese la vacuna anual contra la gripe. Puede colocarse la vacuna contra la gripe a fines de verano, en otoo o en invierno. Pregntele al mdico cundo debe colocarse la vacuna contra la gripe.  Evite el contacto con personas que estn enfermas durante la temporada de resfro y gripe. Generalmente es durante el otoo y el invierno. Comunquese con un mdico si:  Tiene nuevos sntomas.  Tiene los siguientes sntomas: ? Dolor en el pecho. ? Diarrea. ? Fiebre.  La tos empeora.  Produce ms mucosidad.  Siente nuseas o vomita. Solicite ayuda inmediatamente si:  Le falta el aire o tiene dificultad para respirar.  La piel o las uas se tornan de un color azulado.  Presenta dolor intenso o rigidez de cuello.  Le duele la cabeza, la cara o el odo de  forma repentina.  No puede comer ni beber sin vomitar. Resumen  La gripe es una infeccin viral que afecta principalmente las vas respiratorias.  Los sntomas de la gripe normalmente comienzan de repente y duran entre 4 y 14 das.  Colocarse la vacuna anual contra la gripe es la mejor manera de prevenir el contagio de la gripe.  Qudese en su casa y no concurra al trabajo o a la escuela como se lo haya indicado su mdico. A menos que visite al mdico, evite salir de su casa hasta que la fiebre haya desaparecido por 24 horas sin tomar medicamentos.  Concurra a todas las visitas de control como se lo haya indicado el mdico. Esto es importante. Esta informacin no tiene como fin reemplazar el consejo del mdico. Asegrese de hacerle al mdico cualquier pregunta que tenga. Document Released: 02/17/2005 Document Revised: 12/21/2017 Document Reviewed: 12/21/2017 Elsevier Interactive Patient Education  2019 Elsevier Inc.  

## 2018-07-06 LAB — URINE CULTURE: Culture: 80000 — AB

## 2018-07-08 LAB — CULTURE, BLOOD (ROUTINE X 2)
Culture: NO GROWTH
Culture: NO GROWTH
Special Requests: ADEQUATE
Special Requests: ADEQUATE

## 2018-07-13 ENCOUNTER — Encounter: Payer: Self-pay | Admitting: Pediatrics

## 2018-07-13 ENCOUNTER — Ambulatory Visit (INDEPENDENT_AMBULATORY_CARE_PROVIDER_SITE_OTHER): Payer: Medicaid Other | Admitting: Pediatrics

## 2018-07-13 VITALS — BP 90/52 | HR 80 | Ht 58.66 in | Wt 113.4 lb

## 2018-07-13 DIAGNOSIS — Z68.41 Body mass index (BMI) pediatric, 5th percentile to less than 85th percentile for age: Secondary | ICD-10-CM

## 2018-07-13 DIAGNOSIS — Z0001 Encounter for general adult medical examination with abnormal findings: Secondary | ICD-10-CM

## 2018-07-13 DIAGNOSIS — N390 Urinary tract infection, site not specified: Secondary | ICD-10-CM | POA: Diagnosis not present

## 2018-07-13 DIAGNOSIS — Z113 Encounter for screening for infections with a predominantly sexual mode of transmission: Secondary | ICD-10-CM | POA: Diagnosis not present

## 2018-07-13 LAB — POCT URINALYSIS DIPSTICK
Bilirubin, UA: NEGATIVE
Glucose, UA: NEGATIVE
Ketones, UA: NEGATIVE
NITRITE UA: NEGATIVE
Protein, UA: POSITIVE — AB
RBC UA: NEGATIVE
Spec Grav, UA: 1.01 (ref 1.010–1.025)
Urobilinogen, UA: NEGATIVE E.U./dL — AB
pH, UA: 8 (ref 5.0–8.0)

## 2018-07-13 LAB — POCT RAPID HIV: Rapid HIV, POC: NEGATIVE

## 2018-07-13 NOTE — Progress Notes (Signed)
Adolescent Well Care Visit Elizabeth Harrington is a 18 y.o. female who is here for well care.     PCP:  Jonetta Osgood, MD   History was provided by the patient and mother.  Confidentiality was discussed with the patient and, if applicable, with caregiver as well. Patient's personal or confidential phone number:    Current Issues: Current concerns include  Hospitalized recently - flu and dehyration Urine culture done during that admission grew proteus.  Has never had any urinary symptoms and does not currently have Denies constipation or sexual activity.   Mother very worried that she has been sick a lot lately.  All just colds, similar viral symptoms.   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 Menstrual History:    Patient has a dental home: yes  Confidential social history: Tobacco?  no Secondhand smoke exposure?  no Drugs/ETOH?  no  Sexually Active?  no   Pregnancy Prevention: abstinence  Safe at home, in school & in relationships?  Yes Safe to self?  Yes   Screenings:  The patient completed the Rapid Assessment for Adolescent Preventive Services screening questionnaire and the following topics were identified as risk factors and discussed: healthy eating and exercise  In addition, the following topics were discussed as part of anticipatory  guidance healthy eating, exercise, condom use and birth control.  PHQ-9 completed and results indicated no concerns  Physical Exam:  Vitals:   07/13/18 1523  BP: (!) 90/52  Pulse: 80  Weight: 113 lb 6.4 oz (51.4 kg)  Height: 4' 10.66" (1.49 m)   BP (!) 90/52   Pulse 80   Ht 4' 10.66" (1.49 m)   Wt 113 lb 6.4 oz (51.4 kg)   LMP 07/03/2018   BMI 23.17 kg/m  Body mass index: body mass index is 23.17 kg/m. Blood pressure percentiles are not available for patients who are 18 years or older.  No exam data present  Physical Exam Vitals signs and nursing note reviewed.  Constitutional:      General: She is not in acute distress.    Appearance: She is well-developed.  HENT:     Head: Normocephalic.     Right Ear: Tympanic membrane, ear canal and external ear normal.     Left Ear: Tympanic membrane, ear canal and external ear normal.     Nose: Nose normal.     Mouth/Throat:     Pharynx: No oropharyngeal exudate.  Eyes:     Conjunctiva/sclera: Conjunctivae normal.     Pupils: Pupils are equal, round, and reactive to light.  Neck:     Musculoskeletal: Normal range of motion and neck supple.     Thyroid: No thyromegaly.  Cardiovascular:     Rate and Rhythm: Normal rate and regular rhythm.     Heart sounds: Normal heart sounds. No murmur.  Pulmonary:     Effort: Pulmonary effort is normal.     Breath sounds: Normal breath sounds.  Abdominal:     General: Bowel sounds are normal. There is no distension.     Palpations: Abdomen is soft. There is no mass.     Tenderness: There is no abdominal tenderness.  Genitourinary:    Comments: Normal vulva Musculoskeletal: Normal range of motion.  Lymphadenopathy:     Cervical: No cervical adenopathy.  Skin:    General: Skin is warm and dry.     Findings: No rash.  Neurological:     Mental Status: She is alert.     Cranial Nerves: No cranial nerve deficit.      Assessment and Plan:   1. Encounter for general adult medical  examination with abnormal findings  2. Screening examination for venereal disease - C. trachomatis/N. gonorrhoeae RNA - POCT Rapid HIV  3. Body mass index, pediatric, 5th percentile to less than 85th percentile for age Encourage healthy habits  4. Urinary tract infection without hematuria, site unspecified No UTI symptoms but did have positive culture least week  Will repeat - POCT urinalysis dipstick - Urine Culture   BMI is appropriate for age  Hearing screening result:normal Vision screening result: normal  Counseling provided for all of the vaccine components  Orders Placed This Encounter  Procedures  . C. trachomatis/N. gonorrhoeae RNA  . Urine Culture  . POCT Rapid HIV  . POCT urinalysis dipstick    PE in one year   No follow-ups on file.Dory Peru, MD

## 2018-07-14 LAB — URINE CULTURE
MICRO NUMBER:: 221394
SPECIMEN QUALITY: ADEQUATE

## 2018-07-14 LAB — C. TRACHOMATIS/N. GONORRHOEAE RNA
C. trachomatis RNA, TMA: NOT DETECTED
N. gonorrhoeae RNA, TMA: NOT DETECTED

## 2019-09-10 ENCOUNTER — Other Ambulatory Visit: Payer: Self-pay

## 2019-09-10 ENCOUNTER — Telehealth: Payer: Self-pay

## 2019-09-10 ENCOUNTER — Encounter: Payer: Self-pay | Admitting: Pediatrics

## 2019-09-10 ENCOUNTER — Telehealth (INDEPENDENT_AMBULATORY_CARE_PROVIDER_SITE_OTHER): Payer: Medicaid Other | Admitting: Pediatrics

## 2019-09-10 ENCOUNTER — Telehealth: Payer: Self-pay | Admitting: Pediatrics

## 2019-09-10 DIAGNOSIS — G4485 Primary stabbing headache: Secondary | ICD-10-CM | POA: Diagnosis not present

## 2019-09-10 DIAGNOSIS — R519 Headache, unspecified: Secondary | ICD-10-CM

## 2019-09-10 NOTE — Telephone Encounter (Signed)
Mom called and would like to know if there was a prescription for pain meds (Ibuprofen) to the pharmacy. I told her I did not see one but I would ask to make sure.

## 2019-09-10 NOTE — Telephone Encounter (Signed)
-----   Message from Darrall Dears, MD sent at 09/10/2019  9:41 AM EDT ----- Please obtain prior auth for MR Brain with and without contrast. Thanks!

## 2019-09-10 NOTE — Telephone Encounter (Signed)
Medicaid number on file is not active in [place] or [employer].  Medicaid: [id number] T Procedure code: 28833 Diagnosis code: G44.85 Provider: Lyna Poser Facility: [facility]

## 2019-09-10 NOTE — Progress Notes (Signed)
Virtual Visit via Video Note  I connected with Elizabeth Harrington 's self  on 09/10/19 at 10:00 AM EDT by a video enabled telemedicine application and verified that I am speaking with the correct person using two identifiers.   Location of patient/parent: [place], [place]    I discussed the limitations of evaluation and management by telemedicine and the availability of in person appointments.  I discussed that the purpose of this telehealth visit is to provide medical care while limiting exposure to the novel coronavirus.    I advised the patient  that by engaging in this telehealth visit, they consent to the provision of healthcare.  Additionally, they authorize for the patient's insurance to be billed for the services provided during this telehealth visit.  They expressed understanding and agreed to proceed.  Reason for visit:  Headache and vomiting.   History of Present Illness:  Elizabeth Harrington reports that this is her third time waking up with a bad headache on the left side this morning.  The pain has been waking her up at 6am from sleep (she usually wakes up at 7am) The pain is sharp, severe and located on her left frontal area.  Pain is 10 out of 10. There is severe nausea, the HA is relieved after several episodes of vomiting.  She tries to take medications when she wakes up with the pain but throws up immediately.  She took Motrin on Friday, 3 days ago when it first occurred, then threw up.  She then took [employer] but threw it up this morning as well. She has had light sensitivity.      She had HA on Friday, Saturday, none on Sunday and then this morning.  No new activities or travel.  Recently she's been getting about 7-8 hours sleep, a little less than normal. She drinks lots of fluids, prefers water. NO new stressors in her life. She attends college classes at [employer]. She did not attend school today bc of the HA.   She does not normally have HA, when she does, it is mild and in a band across  her forehead, not in one location.    She lives with her mom and sister they are normal.  No new travel in the past 30 days.  No COVID.    No history in the famliy of migraines aside from gradmother who complained of severe pain in her 79s but not now.    Observations/Objective:  Well apearing, no distress.  No focal neuro deficits on limited exam of CN 2-12.    Assessment and Plan:   Elizabeth Harrington is previously healthy adolescent with recent acute onset of HA, associated with focal pain, and photophobia, violent vomiting.  NO focal neurological deficits at the visit. Given severity of pain, red flag symptoms of waking up due to severe pain, relieved by vomiting, it is difficult to rule out increase of intracranial pressure, focal mass without advanced imaging.   1. Sudden onset of severe headache Discussed with patient the differential of migraine HA, stress induced pathology.  Given that patient is not in acute distress at this time and there are no focal exam findings, will not recommend going to ED for status migrainosus treatment. Advised patient to take motrin 600mg  tonight before bed if that will improve her condition waking up in the morning.  Will follow up in one week or sooner if needed.   2. Primary stabbing headache Will obtain prior auth once patient has reinstated Medicaid which has  lapsed per RN review to obtain prior auth.  - MR Brain W Wo Contrast; Future    Follow Up Instructions:  Office will call for scheduling MRI   I discussed the assessment and treatment plan with the patient and/or parent/guardian. They were provided an opportunity to ask questions and all were answered. They agreed with the plan and demonstrated an understanding of the instructions.   They were advised to call back or seek an in-person evaluation in the emergency room if the symptoms worsen or if the condition fails to improve as anticipated.  Time spent reviewing chart in preparation for visit:  5  minutes Time spent face-to-face with patient: 15 minutes Time spent not face-to-face with patient for documentation and care coordination on date of Harrington: 10 minutes  I was located at [facility] during this encounter.  Darrall Dears, MD

## 2019-09-10 NOTE — Telephone Encounter (Signed)
Called mom and notified her of Dr. Sherryll Burger recommendation during the visit. She advised her to take over the counter meds. Motrin 600mg  q8hrs or she can do Tylenol 500mg  q4-6 for pain.advise her to take either med before she goes to bed tonight since when she wakes up with the pain, per Dr. notes.

## 2019-09-10 NOTE — Telephone Encounter (Signed)
Spoke with mom and informed her that Medicaid is not active and we can't obtain PA for the MRI. Mom said that she got a letter from [facility] that she has to fill out and send back. she will send it this week, she said that she will call us when her Medicaid is fixed.

## 2019-09-18 ENCOUNTER — Telehealth: Payer: Self-pay | Admitting: Pediatrics

## 2019-09-18 NOTE — Telephone Encounter (Signed)
I spoke with Elizabeth Harrington this afternoon to check in with progression of her symptoms since our last video visit in which I referred her for MRI Brain  study to evaluate acute onset of  severe HA and vomiting.  Today she states that the HA's have resolved. She is not using any medications for these HA and she has not been woken from sleep due to pain in her head since our call last week.  No further vomiting.  In addition, she states that she called [employer] to clarify the status of her Medicaid and was informed that it would be active again in July.  We will order MRI and obtain prior authorization once we are able to file with insurance.  I urged her to present to care urgently if HA and other symptoms of focal pain and nausea/Vomiting return. She verbalized understanding and will follow up if symptoms persist.

## 2019-09-26 ENCOUNTER — Telehealth: Payer: Self-pay | Admitting: Pediatrics

## 2019-09-26 NOTE — Telephone Encounter (Signed)

## 2019-09-27 ENCOUNTER — Ambulatory Visit (INDEPENDENT_AMBULATORY_CARE_PROVIDER_SITE_OTHER): Payer: Medicaid Other | Admitting: Pediatrics

## 2019-09-27 ENCOUNTER — Encounter: Payer: Self-pay | Admitting: Pediatrics

## 2019-09-27 ENCOUNTER — Other Ambulatory Visit: Payer: Self-pay

## 2019-09-27 ENCOUNTER — Other Ambulatory Visit (HOSPITAL_COMMUNITY)
Admission: RE | Admit: 2019-09-27 | Discharge: 2019-09-27 | Disposition: A | Discharge status: Home / Self Care | Payer: Medicaid Other | Source: Ambulatory Visit | Attending: Pediatrics | Admitting: Pediatrics

## 2019-09-27 VITALS — BP 92/53 | HR 91 | Ht 59.45 in | Wt 124.4 lb

## 2019-09-27 DIAGNOSIS — Z68.41 Body mass index (BMI) pediatric, 5th percentile to less than 85th percentile for age: Secondary | ICD-10-CM | POA: Diagnosis not present

## 2019-09-27 DIAGNOSIS — Z113 Encounter for screening for infections with a predominantly sexual mode of transmission: Secondary | ICD-10-CM | POA: Insufficient documentation

## 2019-09-27 DIAGNOSIS — Z0001 Encounter for general adult medical examination with abnormal findings: Secondary | ICD-10-CM

## 2019-09-27 NOTE — Progress Notes (Signed)
Adolescent Well Care Visit Richele Alene Mires Derrell Lolling is a 19 y.o. female who is here for well care.     PCP:  Jonetta Osgood, MD   History was provided by the patient.  Confidentiality was discussed with the patient and, if applicable, with caregiver as well. Patient's personal or confidential phone number:    Current Issues: Current concerns include  -   Headaches- have improved Were sudden onset and only in the morning MRi ordered but mediciad was not active Headaches have since improved  Nutrition: Nutrition/Eating Behaviors: eats variety; fruits, vegetables Adequate calcium in diet?: yes Supplements/ Vitamins: none  Exercise/ Media: Play any Sports?:  none Exercise:  not active Screen Time:  > 2 hours-counseling provided Media Rules or Monitoring?: no  Sleep:  Sleep: adequate  Social Screening: Parental relations:  good Activities, Work, and Regulatory affairs officer?: in school, 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  no Stressors of note: no  Education: School Name: [employer]  School Grade: School performance: doing well; no concerns School Behavior: doing well; no concerns  Menstruation:   No LMP recorded. Menstrual History: regular - no concerns   Patient has a dental home: yes   Confidential social history: Tobacco?  no Secondhand smoke exposure?  no Drugs/ETOH?  no  Sexually Active?  no   Pregnancy Prevention: abstinence  Safe at home, in school & in relationships?  Yes Safe to self?  Yes   Screenings:  The patient completed the Rapid Assessment for Adolescent Preventive Services screening questionnaire and the following topics were identified as risk factors and discussed: exercise  In addition, the following topics were discussed as part of anticipatory guidance healthy eating, exercise and birth control.  PHQ-9 completed and results indicated no concerns  Physical Exam:  Vitals:   09/27/19 1444  BP: (!) 92/53  Pulse: 91   Weight: 124 lb 6.4 oz (56.4 kg)  Height: 4' 11.45" (1.51 m)   BP (!) 92/53 (BP Location: Right Arm, Patient Position: Sitting, Cuff Size: Normal)   Pulse 91   Ht 4' 11.45" (1.51 m)   Wt 124 lb 6.4 oz (56.4 kg)   BMI 24.75 kg/m  Body mass index: body mass index is 24.75 kg/m. Growth percentile SmartLinks can only be used for patients less than 77 years old.   Hearing Screening   125Hz  250Hz  500Hz  1000Hz  2000Hz  3000Hz  4000Hz  6000Hz  8000Hz   Right ear:   20 20 20  20     Left ear:   20 20 20  20       Visual Acuity Screening   Right eye Left eye Both eyes  Without correction: 20/20  20/20  With correction:       Physical Exam Vitals and nursing note reviewed.  Constitutional:      General: She is not in acute distress.    Appearance: She is well-developed.  HENT:     Head: Normocephalic.     Right Ear: External ear normal.     Left Ear: External ear normal.     Nose: Nose normal.     Mouth/Throat:     Pharynx: No oropharyngeal exudate.  Eyes:     Conjunctiva/sclera: Conjunctivae normal.     Pupils: Pupils are equal, round, and reactive to light.  Neck:     Thyroid: No thyromegaly.  Cardiovascular:     Rate and Rhythm: Normal rate and regular rhythm.     Heart sounds: Normal heart sounds. No murmur.  Pulmonary:  Effort: Pulmonary effort is normal.     Breath sounds: Normal breath sounds.  Abdominal:     General: Bowel sounds are normal. There is no distension.     Palpations: Abdomen is soft. There is no mass.     Tenderness: There is no abdominal tenderness.  Genitourinary:    Comments: Normal vulva Musculoskeletal:        General: Normal range of motion.     Cervical back: Normal range of motion and neck supple.  Lymphadenopathy:     Cervical: No cervical adenopathy.  Skin:    General: Skin is warm and dry.     Findings: No rash.  Neurological:     Mental Status: She is alert.     Cranial Nerves: No cranial nerve deficit.      Assessment and Plan:    1. Encounter for general adult medical examination with abnormal findings  2. Body mass index, pediatric, 5th percentile to less than 85th percentile for age Generally healthy lifestyle reviewed  3. Screening examination for venereal disease - POCT Rapid HIV - Urine cytology ancillary only   Had headaches with concerning features but have now all resolved and no ongoing symptoms. Medicaid still not active. Okay to hold off on MRI for now. General headache cares reviewed. Also reviewed reasons to seek care for headaches  BMI is appropriate for age  Hearing screening result:normal Vision screening result: normal  Counseling provided for all of the vaccine components  Orders Placed This Encounter  Procedures  . POCT Rapid HIV   Vaccines up to date  Refer to adult medicine for ongoing care   No follow-ups on file.Royston Cowper, MD

## 2019-09-28 LAB — URINE CYTOLOGY ANCILLARY ONLY
Chlamydia: NEGATIVE
Comment: NEGATIVE
Comment: NORMAL
Neisseria Gonorrhea: NEGATIVE

## 2019-09-28 LAB — POCT RAPID HIV: Rapid HIV, POC: NEGATIVE

## 2019-10-01 ENCOUNTER — Telehealth: Payer: Self-pay | Admitting: Pediatrics

## 2019-10-01 ENCOUNTER — Other Ambulatory Visit: Payer: Self-pay

## 2019-10-01 ENCOUNTER — Ambulatory Visit (INDEPENDENT_AMBULATORY_CARE_PROVIDER_SITE_OTHER): Payer: Medicaid Other | Admitting: Pediatrics

## 2019-10-01 VITALS — Temp 97.5°F | Wt 123.4 lb

## 2019-10-01 DIAGNOSIS — R21 Rash and other nonspecific skin eruption: Secondary | ICD-10-CM

## 2019-10-01 MED ORDER — PERMETHRIN 5 % EX CREA: TOPICAL_CREAM | CUTANEOUS | 0 refills | Status: DC

## 2019-10-01 NOTE — Telephone Encounter (Signed)
Pre-screening for onsite visit ° °1. Who is bringing the patient to the visit? Patient  ° °Informed only one adult can bring patient to the visit to limit possible exposure to COVID19 and facemasks must be worn while in the building by the patient (ages 2 and older) and adult. ° °2. Has the person bringing the patient or the patient been around anyone with suspected or confirmed COVID-19 in the last 14 days? NO  ° °3. Has the person bringing the patient or the patient been around anyone who has been tested for COVID-19 in the last 14 days? NO ° °4. Has the person bringing the patient or the patient had any of these symptoms in the last 14 days? NO  ° °Fever (temp 100 F or higher) °Breathing problems °Cough °Sore throat °Body aches °Chills °Vomiting °Diarrhea °Loss of taste or smell ° ° °If all answers are negative, advise patient to call our office prior to your appointment if you or the patient develop any of the symptoms listed above. °  °If any answers are yes, cancel in-office visit and schedule the patient for a same day telehealth visit with a provider to discuss the next steps. °

## 2019-10-01 NOTE — Patient Instructions (Signed)
It is unclear exactly what is causing your rash.  At this time we will treat for scabies with permethrin cream.  You will need to apply this from your neck to your toes and then wash off in 12 hours.  You will need to repeat this in 7 days.  You also need to wash clothing and bedding in hot water with each treatment.  Please continue to monitor for new skin eruptions and look for possible triggers.  I do recommend that you wear gloves when using any chemicals.  Take care, Dr. Mauri Reading

## 2019-10-01 NOTE — Assessment & Plan Note (Signed)
Patient presents with acute eruption of multiple pruritic vesicles on dorsum and lateral aspects of hands bilaterally. Denies any other systemic symptoms. No recent travel or exposure to nature. No changes in soaps, lotions, or detergents. No prior history of rashes.   Etiology is unclear at this time however differential includes scabies eruption given prior history and exposure to new homes vs contact dermatitis from cleaning supplies. At this time, will treat for scabies with permethrin cream. Instructions for use provided including washing bedding with hot water. Recommended patient wear gloves when using cleaning supplies and stop using Chlorophyl tablets. Recommended she continue to monitor and look for associated triggers. She should return for follow-up if no improvement. She voiced understanding and agreement with plan.

## 2019-10-01 NOTE — Progress Notes (Signed)
Subjective:     Elizabeth Harrington, is a 19 y.o. female presenting today for rash on hands.  No interpreter necessary.  Chief Complaint  Patient presents with  . Blister    fluid filled spots on hands and forearms, one area upper arm. very itchy. started Friday. no cold sx, mouth sores or fever.     HPI:  Rash on hands, bilateral Duration: 2-3 days Location: dorsal and lateral hands bilaterally, forearms bilaterally  Context: She notes she had one spot appear on left posterior lateral risk on Friday that started to go away but then more spots started appearing. Associated Symptoms: Very itchy, blisters that pop with clear fluid.  Treatment: None  History: History of scabies in the past. Denies any recent travel.  Notes she went to [place] 2-3 weeks, otherwise has not been outside. Denies anyone else having a similar rash. Denies anyone else with rash. Denies any new exposures to new things. Denies excessive hand washing. Denies any animal exposures or insect exposures.  Denies any pain to spots. Denies being sexually active. Denies changes in lotion, soaps, or detergents.  She notes that she lives with her sister and mom in an apartment. Does not share a bed with anyone.  Does note she started using OTC "Chlorophil Digestive pills" 2-3 weeks ago. 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 She denies any other chemical exposure. She has used windex before without issue.   Review of Systems  Constitutional: Negative.   HENT: Negative.   Eyes: Negative.   Respiratory: Negative.   Cardiovascular: Negative.   Gastrointestinal: Negative.   Genitourinary: Negative.   Musculoskeletal: Negative.   Skin: Positive for rash.  Allergic/Immunologic: Negative.   Neurological: Negative.   All other systems reviewed and are negative.   Patient's history was reviewed and updated as appropriate: allergies, current medications,  past family history, past medical history, past social history, past surgical history and problem list.     Objective:     Temperature (!) 97.5 F (36.4 C), temperature source Temporal, weight 123 lb 6.4 oz (56 kg).  Physical Exam Constitutional:      Appearance: Normal appearance. She is normal weight.  HENT:     Head: Normocephalic and atraumatic.     Mouth/Throat:     Mouth: Mucous membranes are moist.     Pharynx: Oropharynx is clear.     Comments: No oral lesions Skin:    General: Skin is warm and dry.     Findings: Rash (multiple small vesicles of clear fluid on dorsum and lateral aspects of hands bilaterally, one located on right forearm and upper arm, small area of erythema surrounding two spots otherwise no surrounding erythema) present.     Comments: No excoriations or other lesions/erythema/rash in finger webs or palms. No rash on ankles/feet bilaterally. No other spots located on trunk Lesions without central umbilication.  Neurological:     Mental Status: She is alert.              Assessment & Plan:   Nonspecific Skin Eruption: Patient presents with acute eruption of multiple pruritic vesicles on dorsum and lateral aspects of hands bilaterally. Denies any other systemic symptoms. No recent travel or exposure to nature. No changes in soaps, lotions, or detergents. No prior history of rashes.   Etiology is unclear at this time however differential includes scabies eruption given prior history and exposure to new homes vs contact  dermatitis from cleaning supplies vs dishydrotic eczema. At this time, will treat for scabies with permethrin cream. Instructions for use provided including washing bedding with hot water. Recommended patient wear gloves when using cleaning supplies and stop using Chlorophyl tablets. Recommended she continue to monitor and look for associated triggers. She should return for follow-up if no improvement. She voiced understanding and  agreement with plan.  Supportive care and return precautions reviewed.  Return if symptoms worsen or fail to improve.  Orpah Cobb, DO [facility], PGY2 10/01/2019 3:47 PM   ==================================== ATTENDING ATTESTATION: I saw and evaluated the patient, performing the key elements of the service. I developed the management plan that is described in the resident's note, and I agree with the content.   Whitney Haddix                  10/01/2019, 4:52 PM

## 2019-11-14 NOTE — Telephone Encounter (Signed)
Approval #[id number] valid throught 05/12/20 obtained via [employer]. Routing to [facility] for scheduling and family notification.

## 2019-11-14 NOTE — Telephone Encounter (Signed)
Hey Levora Dredge or Vernona Rieger,   I just checked this Medicaid and it looks like it is active now. Would it possible to get a PA on this MRI Brain please?   Thank you  Denisa

## 2019-11-15 NOTE — Telephone Encounter (Signed)
Appointment has been scheduled and patient is aware. Sent a text message, lvm and and sent letter.

## 2019-11-29 ENCOUNTER — Ambulatory Visit (HOSPITAL_COMMUNITY): Admission: RE | Admit: 2019-11-29 | Payer: Medicaid Other | Source: Ambulatory Visit

## 2019-12-11 ENCOUNTER — Other Ambulatory Visit: Payer: Self-pay

## 2019-12-11 ENCOUNTER — Ambulatory Visit (HOSPITAL_COMMUNITY)
Admission: RE | Admit: 2019-12-11 | Discharge: 2019-12-11 | Disposition: A | Discharge status: Home / Self Care | Payer: Medicaid Other | Source: Ambulatory Visit | Attending: Pediatrics | Admitting: Pediatrics

## 2019-12-11 ENCOUNTER — Other Ambulatory Visit: Payer: Self-pay | Admitting: Pediatrics

## 2019-12-11 DIAGNOSIS — G4485 Primary stabbing headache: Secondary | ICD-10-CM | POA: Diagnosis not present

## 2019-12-11 NOTE — Progress Notes (Signed)
Patient refused Gadavist (gadolinium) contrast. MRI brain without completed.

## 2020-05-05 ENCOUNTER — Other Ambulatory Visit: Payer: Self-pay

## 2020-05-05 ENCOUNTER — Ambulatory Visit (INDEPENDENT_AMBULATORY_CARE_PROVIDER_SITE_OTHER): Payer: Medicaid Other | Admitting: Family

## 2020-05-05 ENCOUNTER — Encounter: Payer: Self-pay | Admitting: Family

## 2020-05-05 VITALS — BP 119/82 | HR 79 | Temp 98.4°F | Ht 59.96 in | Wt 121.3 lb

## 2020-05-05 DIAGNOSIS — Z23 Encounter for immunization: Secondary | ICD-10-CM

## 2020-05-05 DIAGNOSIS — Z7689 Persons encountering health services in other specified circumstances: Secondary | ICD-10-CM | POA: Diagnosis not present

## 2020-05-05 NOTE — Progress Notes (Signed)
New Patient Office Visit  Subjective:  Patient ID: Elizabeth Harrington, female    DOB: Apr 06, 2001  Age: 19 y.o. MRN: [id number]  CC: Establish Care  Chief Complaint  Patient presents with  . Establish Care    HPI [facility] Derrell Lolling presents to establish care. 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  Past Medical History:  Diagnosis Date  . No pertinent past medical history     Past Surgical History:  Procedure Laterality Date  . WISDOM TOOTH EXTRACTION      Family History  Problem Relation Age of Onset  . Cancer Mother     Mother, living: cervical cancer, breast cancer   Social History   Socioeconomic History  . Marital status: Single    Spouse name: Not on file  . Number of children: Not on file  . Years of education: Not on file  . Highest education level: Not on file  Occupational History  . Not on file  Tobacco Use  . Smoking status: Never Smoker  . Smokeless tobacco: Never Used  Vaping Use  . Vaping Use: Never used  Substance and Sexual Activity  . Alcohol use: Never    Alcohol/week: 0.0 standard drinks  . Drug use: Never  . Sexual activity: Yes    Birth control/protection: Condom  Other Topics Concern  . Not on file  Social History Narrative   ** Merged History Encounter **       Social Determinants of Health   Financial Resource Strain: Not on file  Food Insecurity: Not on file  Transportation Needs: Not on file  Physical Activity: Unknown  . Days of Exercise per Week: 0 days  . Minutes of Exercise per Session: Not on file  Stress: Not on file  Social Connections: Not on file  Intimate Partner Violence: Not on file    ROS Review of Systems  Objective:   Today's Vitals: BP 119/82 (BP Location: Left Arm, Patient Position: Sitting)   Pulse 79   Temp 98.4 F (36.9 C)   Ht 4' 11.96" (1.523 m)   Wt 121 lb 4.8 oz (55 kg)   LMP 03/30/2020   SpO2 98%   BMI 23.72 kg/m   Physical Exam Constitutional:      Appearance:  Normal appearance.  HENT:     Head: Normocephalic.  Eyes:     Extraocular Movements: Extraocular movements intact.     Pupils: Pupils are equal, round, and reactive to light.  Cardiovascular:     Rate and Rhythm: Normal rate and regular rhythm.  Musculoskeletal:     Cervical back: Normal range of motion and neck supple.  Neurological:     General: No focal deficit present.     Mental Status: She is alert and oriented to person, place, and time.  Psychiatric:        Mood and Affect: Mood normal.        Behavior: Behavior normal.        Thought Content: Thought content normal.        Judgment: Judgment normal.    Assessment & Plan:  1. Encounter to establish care: - Patient presents today to establish care.  - Return in 4 to 6 weeks or sooner if needed for annual physical examination and PAP smear.  2. Need for immunization against influenza: - Flu vaccine administered today in clinic. - Flu Vaccine QUAD 36+ mos IM  Problem List Items Addressed This Visit   None   Visit Diagnoses  Need for immunization against influenza    -  Primary   Relevant Orders   Flu Vaccine QUAD 36+ mos IM (Completed)      Outpatient Encounter Medications as of 05/05/2020  Medication Sig  . guaiFENesin (MUCINEX) 600 MG 12 hr tablet Take 1 tablet (600 mg total) by mouth 2 (two) times daily. (Patient not taking: No sig reported)  . permethrin (ELIMITE) 5 % cream Apply all over skin today. Wash off in 12 hours. Repeat in 7 days. (Patient not taking: Reported on 05/05/2020)   No facility-administered encounter medications on file as of 05/05/2020.    Follow-up: Return in about 6 weeks (around 06/16/2020) for NP Zonia Kief.   Rema Fendt, NP

## 2020-05-05 NOTE — Patient Instructions (Signed)
Return in 4 to 6 weeks or sooner if needed for physical examination and PAP smear.   Pap Test Why am I having this test? A Pap test, also called a Pap smear, is a screening test to check for signs of:  Cancer of the vagina, cervix, and uterus. The cervix is the lower part of the uterus that opens into the vagina.  Infection.  Changes that may be a sign that cancer is developing (precancerous changes). Women need this test on a regular basis. In general, you should have a Pap test every 3 years until you reach menopause or age 75. Women aged 30-60 may choose to have their Pap test done at the same time as an HPV (human papillomavirus) test every 5 years (instead of every 3 years). Your health care provider may recommend having Pap tests more or less often depending on your medical conditions and past Pap test results. What kind of sample is taken?  Your health care provider will collect a sample of cells from the surface of your cervix. This will be done using a small cotton swab, plastic spatula, or brush. This sample is often collected during a pelvic exam, when you are lying on your back on an exam table with feet in footrests (stirrups). In some cases, fluids (secretions) from the cervix or vagina may also be collected. How do I prepare for this test?  Be aware of where you are in your menstrual cycle. If you are menstruating on the day of the test, you may be asked to reschedule.  You may need to reschedule if you have a known vaginal infection on the day of the test.  Follow instructions from your health care provider about: ? Changing or stopping your regular medicines. Some medicines can cause abnormal test results, such as digitalis and tetracycline. ? Avoiding douching or taking a bath the day before or the day of the test. Tell a health care provider about:  Any allergies you have.  All medicines you are taking, including vitamins, herbs, eye drops, creams, and  over-the-counter medicines.  Any blood disorders you have.  Any surgeries you have had.  Any medical conditions you have.  Whether you are pregnant or may be pregnant. How are the results reported? Your test results will be reported as either abnormal or normal. A false-positive result can occur. A false positive is incorrect because it means that a condition is present when it is not. A false-negative result can occur. A false negative is incorrect because it means that a condition is not present when it is. What do the results mean? A normal test result means that you do not have signs of cancer of the vagina, cervix, or uterus. An abnormal result may mean that you have:  Cancer. A Pap test by itself is not enough to diagnose cancer. You will have more tests done in this case.  Precancerous changes in your vagina, cervix, or uterus.  Inflammation of the cervix.  An STD (sexually transmitted disease).  A fungal infection.  A parasite infection. Talk with your health care provider about what your results mean. Questions to ask your health care provider Ask your health care provider, or the department that is doing the test:  When will my results be ready?  How will I get my results?  What are my treatment options?  What other tests do I need?  What are my next steps? Summary  In general, women should have a Pap  test every 3 years until they reach menopause or age 20.  Your health care provider will collect a sample of cells from the surface of your cervix. This will be done using a small cotton swab, plastic spatula, or brush.  In some cases, fluids (secretions) from the cervix or vagina may also be collected. This information is not intended to replace advice given to you by your health care provider. Make sure you discuss any questions you have with your health care provider. Document Revised: 01/17/2017 Document Reviewed: 01/17/2017 Elsevier Patient Education   2020 [employer].

## 2020-05-05 NOTE — Progress Notes (Signed)
Establish care

## 2020-05-30 ENCOUNTER — Emergency Department (HOSPITAL_COMMUNITY)
Admission: EM | Admit: 2020-05-30 | Discharge: 2020-05-30 | Disposition: A | Discharge status: Home / Self Care | Payer: Medicaid Other | Attending: Emergency Medicine | Admitting: Emergency Medicine

## 2020-05-30 DIAGNOSIS — U071 COVID-19: Secondary | ICD-10-CM | POA: Insufficient documentation

## 2020-05-30 DIAGNOSIS — R112 Nausea with vomiting, unspecified: Secondary | ICD-10-CM | POA: Insufficient documentation

## 2020-05-30 DIAGNOSIS — J029 Acute pharyngitis, unspecified: Secondary | ICD-10-CM | POA: Diagnosis not present

## 2020-05-30 DIAGNOSIS — R0981 Nasal congestion: Secondary | ICD-10-CM | POA: Diagnosis present

## 2020-05-30 LAB — CBC
HCT: 41.7 % (ref 36.0–46.0)
Hemoglobin: 14.5 g/dL (ref 12.0–15.0)
MCH: 32.4 pg (ref 26.0–34.0)
MCHC: 34.8 g/dL (ref 30.0–36.0)
MCV: 93.1 fL (ref 80.0–100.0)
Platelets: 236 10*3/uL (ref 150–400)
RBC: 4.48 MIL/uL (ref 3.87–5.11)
RDW: 11.9 % (ref 11.5–15.5)
WBC: 7.5 10*3/uL (ref 4.0–10.5)
nRBC: 0 % (ref 0.0–0.2)

## 2020-05-30 LAB — GROUP A STREP BY PCR: Group A Strep by PCR: NOT DETECTED

## 2020-05-30 LAB — I-STAT BETA HCG BLOOD, ED (MC, WL, AP ONLY): I-stat hCG, quantitative: <5 m[IU]/mL (ref <5)

## 2020-05-30 LAB — COMPREHENSIVE METABOLIC PANEL
ALT: 17 U/L (ref 0–44)
AST: 20 U/L (ref 15–41)
Albumin: 4.5 g/dL (ref 3.5–5.0)
Alkaline Phosphatase: 61 U/L (ref 38–126)
Anion gap: 15 (ref 5–15)
BUN: 12 mg/dL (ref 6–20)
CO2: 18 mmol/L — ABNORMAL LOW (ref 22–32)
Calcium: 9.8 mg/dL (ref 8.9–10.3)
Chloride: 106 mmol/L (ref 98–111)
Creatinine, Ser: 0.7 mg/dL (ref 0.44–1.00)
GFR, Estimated: >60 mL/min (ref >60)
Glucose, Bld: 105 mg/dL — ABNORMAL HIGH (ref 70–99)
Potassium: 3.9 mmol/L (ref 3.5–5.1)
Sodium: 139 mmol/L (ref 135–145)
Total Bilirubin: 1.1 mg/dL (ref 0.3–1.2)
Total Protein: 7.9 g/dL (ref 6.5–8.1)

## 2020-05-30 LAB — SARS CORONAVIRUS 2 (TAT 6-24 HRS): SARS Coronavirus 2: POSITIVE — AB

## 2020-05-30 LAB — LIPASE, BLOOD: Lipase: 29 U/L (ref 11–51)

## 2020-05-30 MED ORDER — ONDANSETRON HCL 4 MG/2ML IJ SOLN
4.0000 mg | Freq: Once | INTRAMUSCULAR | Status: AC
  Administered 2020-05-30: 4 mg via INTRAVENOUS
  Filled 2020-05-30: qty 2

## 2020-05-30 MED ORDER — ONDANSETRON 4 MG PO TBDP
4.0000 mg | ORAL_TABLET | Freq: Once | ORAL | Status: AC | PRN
  Administered 2020-05-30: 4 mg via ORAL

## 2020-05-30 MED ORDER — ONDANSETRON 4 MG PO TBDP: 4.0000 mg | ORAL_TABLET | Freq: Three times a day (TID) | ORAL | 0 refills | Status: DC | PRN

## 2020-05-30 MED ORDER — SODIUM CHLORIDE 0.9 % IV BOLUS
1000.0000 mL | Freq: Once | INTRAVENOUS | Status: AC
  Administered 2020-05-30: 1000 mL via INTRAVENOUS

## 2020-05-30 MED ORDER — KETOROLAC TROMETHAMINE 30 MG/ML IJ SOLN
30.0000 mg | Freq: Once | INTRAMUSCULAR | Status: AC
  Administered 2020-05-30: 30 mg via INTRAVENOUS
  Filled 2020-05-30: qty 1

## 2020-05-30 MED ORDER — DEXAMETHASONE SODIUM PHOSPHATE 10 MG/ML IJ SOLN
10.0000 mg | Freq: Once | INTRAMUSCULAR | Status: AC
  Administered 2020-05-30: 10 mg via INTRAVENOUS
  Filled 2020-05-30: qty 1

## 2020-05-30 MED ORDER — LIDOCAINE VISCOUS HCL 2 % MT SOLN
2.0000 mL | OROMUCOSAL | 0 refills | Status: DC | PRN
Start: 2020-05-30 — End: 2022-09-17

## 2020-05-30 NOTE — ED Provider Notes (Signed)
[facility] EMERGENCY DEPARTMENT Provider Note   CSN: [id number] Arrival date & time: 05/30/20  1134     History Chief Complaint  Patient presents with  . Emesis  . Sore Throat    Elizabeth Harrington is a 20 y.o. female presents for evaluation of sore throat, vomiting that began last night.  Patient reports that she had started to feel bad yesterday.  She reported having some sore throat, nasal congestion, rhinorrhea.  She has not noted any cough.  She states that her mom and sister have been sick with similar symptoms.  They got COVID tested yesterday but they do not know the results of COVID test.  She reports that last night at about 7 PM, she started have vomiting.  She states that since then, she has not been able to tolerate p.o.  She states that she took Mucinex and Cepacol for her sore throat but it was not helping.  She states that it hurts when she swallows.  Sometimes she can swallow but other times she states it hurts too much if she spits up.  She has not noted any blood in the vomit.  She reports that after vomiting, she started having some mild upper epigastric abdominal pain.  She has not noted any fevers, chills.  She denies any chest pain, difficulty breathing.  She has been vaccinated for COVID.  No known COVID exposure that she knows of.  The history is provided by the patient.       Past Medical History:  Diagnosis Date  . No pertinent past medical history     Patient Active Problem List   Diagnosis Date Noted  . Rash and nonspecific skin eruption 10/01/2019  . Adjustment disorder with depressed mood 11/13/2015    Past Surgical History:  Procedure Laterality Date  . WISDOM TOOTH EXTRACTION       OB History    Gravida  0   Para  0   Term  0   Preterm  0   AB  0   Living        SAB  0   IAB  0   Ectopic  0   Multiple      Live Births              Family History  Problem Relation Age of Onset  . Cancer Mother      Social History   Tobacco Use  . Smoking status: Never Smoker  . Smokeless tobacco: Never Used  Vaping Use  . Vaping Use: Never used  Substance Use Topics  . Alcohol use: Never    Alcohol/week: 0.0 standard drinks  . Drug use: Never    Home Medications Prior to Admission medications   Medication Sig Start Date End Date Taking? Authorizing Provider  ibuprofen (ADVIL) 200 MG tablet Take 400 mg by mouth every 6 (six) hours as needed for fever or headache (pain).   Yes [provider]  lidocaine (XYLOCAINE) 2 % solution Use as directed 2 mLs in the mouth or throat as needed for mouth pain. 05/30/20  Yes Maxwell Caul, PA-C  menthol-cetylpyridinium (CEPACOL) 3 MG lozenge Take 1 lozenge by mouth as needed for sore throat.   Yes [provider]  ondansetron (ZOFRAN ODT) 4 MG disintegrating tablet Take 1 tablet (4 mg total) by mouth every 8 (eight) hours as needed for nausea or vomiting. 05/30/20  Yes Graciella Freer A, PA-C  Phenylephrine-DM-GG-APAP (MUCINEX FAST-MAX CLD FLU  THRT) 5-10-200-325 MG TABS Take 2 capsules by mouth 2 (two) times daily as needed (sore throat).   Yes [provider]    Allergies    Patient has no known allergies.  Review of Systems   Review of Systems  Constitutional: Negative for fever.  HENT: Positive for congestion, sore throat and trouble swallowing.   Respiratory: Negative for cough and shortness of breath.   Cardiovascular: Negative for chest pain.  Gastrointestinal: Positive for vomiting. Negative for abdominal pain and nausea.  Genitourinary: Negative for dysuria and hematuria.  Neurological: Negative for headaches.  All other systems reviewed and are negative.   Physical Exam Updated Vital Signs BP 97/80 (BP Location: Right Arm)   Pulse 96   Temp 99.4 F (37.4 C) (Oral)   Resp 16   Ht 4\' 11"  (1.499 m)   Wt 56.7 kg   SpO2 98%   BMI 25.25 kg/m   Physical Exam Vitals and nursing note reviewed.   Constitutional:      Appearance: Normal appearance. She is well-developed and well-nourished.     Comments: Sitting comfortably on examination table  HENT:     Head: Normocephalic and atraumatic.     Mouth/Throat:     Mouth: Oropharynx is clear and moist and mucous membranes are normal.     Comments: Posterior oropharynx is erythematous.  No edema, uvula is midline.  Airways patent.  Phonation is intact.  No exudates noted. Eyes:     General: Lids are normal.     Extraocular Movements: EOM normal.     Conjunctiva/sclera: Conjunctivae normal.     Pupils: Pupils are equal, round, and reactive to light.  Cardiovascular:     Rate and Rhythm: Normal rate and regular rhythm.     Pulses: Normal pulses.     Heart sounds: Normal heart sounds. No murmur heard. No friction rub. No gallop.   Pulmonary:     Effort: Pulmonary effort is normal.     Breath sounds: Normal breath sounds.     Comments: Lungs clear to auscultation bilaterally.  Symmetric chest rise.  No wheezing, rales, rhonchi. Abdominal:     Palpations: Abdomen is soft. Abdomen is not rigid.     Tenderness: There is no abdominal tenderness. There is no guarding.     Comments: Abdomen is soft, non-distended, non-tender. No rigidity, No guarding. No peritoneal signs.   Musculoskeletal:        General: Normal range of motion.     Cervical back: Full passive range of motion without pain.  Skin:    General: Skin is warm and dry.     Capillary Refill: Capillary refill takes less than 2 seconds.  Neurological:     Mental Status: She is alert and oriented to person, place, and time.  Psychiatric:        Mood and Affect: Mood and affect normal.        Speech: Speech normal.     ED Results / Procedures / Treatments   Labs (all labs ordered are listed, but only abnormal results are displayed) Labs Reviewed  COMPREHENSIVE METABOLIC PANEL - Abnormal; Notable for the following components:      Result Value   CO2 18 (*)    Glucose,  Bld 105 (*)    All other components within normal limits  GROUP A STREP BY PCR  SARS CORONAVIRUS 2 (TAT 6-24 HRS)  LIPASE, BLOOD  CBC  URINALYSIS, ROUTINE W REFLEX MICROSCOPIC  I-STAT BETA HCG BLOOD, ED ([facility],  [facility], [facility] ONLY)    EKG None  Radiology No results found.  Procedures Procedures (including critical care time)  Medications Ordered in ED Medications  ondansetron (ZOFRAN-ODT) disintegrating tablet 4 mg (4 mg Oral Given 05/30/20 1208)  sodium chloride 0.9 % bolus 1,000 mL (0 mLs Intravenous Stopped 05/30/20 1625)  ondansetron (ZOFRAN) injection 4 mg (4 mg Intravenous Given 05/30/20 1454)  ketorolac (TORADOL) 30 MG/ML injection 30 mg (30 mg Intravenous Given 05/30/20 1457)  dexamethasone (DECADRON) injection 10 mg (10 mg Intravenous Given 05/30/20 1455)    ED Course  I have reviewed the triage vital signs and the nursing notes.  Pertinent labs & imaging results that were available during my care of the patient were reviewed by me and considered in my medical decision making (see chart for details).    MDM Rules/Calculators/A&P                          20 year old female who presents for evaluation of sore throat, nausea/vomiting that began yesterday.  She reports she is also had some runny nose, nasal congestion.  Mom and sister at home with similar symptoms.  She has been vaccinated for COVID.  She reports that sometimes she can swallow but other times it is painful and so she will spit.  She has not any trouble breathing.  She reported some epigastric abdominal pain after vomiting but states that is improved.  On initial arrival, she is afebrile, she is slightly tachycardic.  Vitals otherwise stable.  On my evaluation, her blood pressures were slightly low in the 90s.  Her abdomen was benign.  Posterior oropharynx was erythematous but no edema, exudates.  Uvula is midline.  Airways patent, fascia is intact.  History/physical exam is nonconcerning for Ludwig angina or peritonsillar  abscess.  Consider tonsillitis versus pharyngitis.  Also consider COVID-19.  History/physical exam is not concerning for appendicitis.  Labs, strep, COVID ordered at triage.  We will plan to give fluids here in the ED and reassess.  CMP shows BUN and creatinine within normal limits.  CBC shows no leukocytosis.  Lipase is normal.  I-STAT beta is negative.  Strep is negative.  Reevaluation after fluids, medications.  Patient reports that her throat feels better.  Her heart rate has improved after fluids.  Patient reports the nausea has improved after Zofran.  She was able to drink ginger ale in the department any difficulty.  She is able to tolerate secretions.  On my exam, she has no active drooling.  Patient reports feeling better.  Repeat abdominal exam shows no tenderness in the right lower quadrant that would be concerning for appendicitis.  The patient that she still has a COVID test pending.  Her symptoms very well could be COVID-related.  She especially has has had sick contacts.  At this time, she is hemodynamically stable, able to tolerate p.o. without any difficulty.  Patient has no phonation abnormalities and is not drooling on exam. At this time, patient exhibits no emergent life-threatening condition that require further evaluation in ED. Patient had ample opportunity for questions and discussion. All patient's questions were answered with full understanding. Strict return precautions discussed. Patient expresses understanding and agreement to plan.   Elizabeth Harrington was evaluated in Emergency Department on 05/30/2020 for the symptoms described in the history of present illness. She was evaluated in the context of the global COVID-19 pandemic, which necessitated consideration that the patient might be at risk for infection  with the SARS-CoV-2 virus that causes COVID-19. Institutional protocols and algorithms that pertain to the evaluation of patients at risk for COVID-19 are in a state of rapid  change based on information released by regulatory bodies including the CDC and federal and state organizations. These policies and algorithms were followed during the patient's care in the ED.  Portions of this note were generated with [employer]. Dictation errors may occur despite best attempts at proofreading.  Final Clinical Impression(s) / ED Diagnoses Final diagnoses:  Sore throat  Non-intractable vomiting with nausea, unspecified vomiting type    Rx / DC Orders ED Discharge Orders         Ordered    ondansetron (ZOFRAN ODT) 4 MG disintegrating tablet  Every 8 hours PRN        05/30/20 1643    lidocaine (XYLOCAINE) 2 % solution  As needed        05/30/20 1643           Desma Mcgregor 05/30/20 1854    Wyvonnia Dusky, MD 05/30/20 [phone]

## 2020-05-30 NOTE — Discharge Instructions (Addendum)
Your strep test was negative.  As we discussed, you can still have a viral pharyngitis.  This does not need antibiotics.  You can take Tylenol or Ibuprofen as directed for pain. You can alternate Tylenol and Ibuprofen every 4 hours. If you take Tylenol at 1pm, then you can take Ibuprofen at 5pm. Then you can take Tylenol again at 9pm.   Take viscous lidocaine as directed.  Take Zofran for nausea/vomiting.  As we discussed, you have a COVID test pending.  You can check online or on the MyChart app regarding your results.  If you are positive, you will need to quarantine.  Return to the emergency department for any lower abdominal pain, particular in the right lower side, persistent vomiting, difficulty breathing, inability swallow your saliva or any other worsening or concerning symptoms.

## 2020-05-30 NOTE — ED Triage Notes (Signed)
Pt here with reports of emesis and sore throat onset yesterday. Reports her sister has  Been sick.

## 2020-06-05 DIAGNOSIS — Z1152 Encounter for screening for COVID-19: Secondary | ICD-10-CM | POA: Diagnosis not present

## 2020-06-12 DIAGNOSIS — Z1152 Encounter for screening for COVID-19: Secondary | ICD-10-CM | POA: Diagnosis not present

## 2021-03-06 ENCOUNTER — Other Ambulatory Visit: Payer: Self-pay

## 2021-03-06 ENCOUNTER — Telehealth (INDEPENDENT_AMBULATORY_CARE_PROVIDER_SITE_OTHER): Payer: Medicaid Other | Admitting: Nurse Practitioner

## 2021-03-06 ENCOUNTER — Encounter: Payer: Self-pay | Admitting: Nurse Practitioner

## 2021-03-06 DIAGNOSIS — R519 Headache, unspecified: Secondary | ICD-10-CM | POA: Diagnosis not present

## 2021-03-06 DIAGNOSIS — G8929 Other chronic pain: Secondary | ICD-10-CM

## 2021-03-06 MED ORDER — ONDANSETRON 4 MG PO TBDP: 4.0000 mg | ORAL_TABLET | Freq: Three times a day (TID) | ORAL | 0 refills | Status: DC | PRN

## 2021-03-06 MED ORDER — SUMATRIPTAN SUCCINATE 25 MG PO TABS: 25.0000 mg | ORAL_TABLET | ORAL | 0 refills | Status: DC | PRN

## 2021-03-06 NOTE — Progress Notes (Signed)
Virtual Visit via Telephone Note  I connected with Elizabeth Harrington on 03/06/21 at 10:20 AM EDT by telephone and verified that I am speaking with the correct person using two identifiers.  Location: Patient: home Provider: office   I discussed the limitations, risks, security and privacy concerns of performing an evaluation and management service by telephone and the availability of in person appointments. I also discussed with the patient that there may be a patient responsible charge related to this service. The patient expressed understanding and agreed to proceed.   History of Present Illness:  Patient presents today for chronic headaches through televisit.  She describes these headaches as debilitating requiring her to lay down in bed.  She describes these headaches as pounding frontal type headaches.  She states that she does have light sensitivity when she is having these headaches.  She did have an MRI last year which was normal.  Patient has tried over-the-counter Excedrin, Motrin, Tylenol with minimal relief noted.  She states that she does get very nauseated when she has these headaches. Denies f/c/s, n/v/d, hemoptysis, PND, chest pain or edema.     Observations/Objective:  Vitals with BMI 05/30/2020 05/30/2020 05/30/2020  Height - - -  Weight - - -  BMI - - -  Systolic 97 94 102  Diastolic 80 58 67  Pulse 96 115 113      Assessment and Plan:  Chronic Headaches:  Most likely Migraine  Will order Imitrex  Will order Zofran for associated nausea  Please keep headache log  Follow up:  Follow up with PCP in 1 month or sooner if needed  Patient Instructions  Chronic Headaches:  Most likely Migraine  Will order Imitrex  Will order Zofran for associated nausea  Please keep headache log  Follow up:  Follow up with PCP in 1 month or sooner if needed  Migraine Headache A migraine headache is a very strong throbbing pain on one side or both sides of your  head. This type of headache can also cause other symptoms. It can last from 4 hours to 3 days. Talk with your doctor about what things may bring on (trigger) this condition. What are the causes? The exact cause of this condition is not known. This condition may be triggered or caused by: Drinking alcohol. Smoking. Taking medicines, such as: Medicine used to treat chest pain (nitroglycerin). Birth control pills. Estrogen. Some blood pressure medicines. Eating or drinking certain products. Doing physical activity. Other things that may trigger a migraine headache include: Having a menstrual period. Pregnancy. Hunger. Stress. Not getting enough sleep or getting too much sleep. Weather changes. Tiredness (fatigue). What increases the risk? Being 85-62 years old. Being female. Having a family history of migraine headaches. Being Caucasian. Having depression or anxiety. Being very overweight. What are the signs or symptoms? A throbbing pain. This pain may: Happen in any area of the head, such as on one side or both sides. Make it hard to do daily activities. Get worse with physical activity. Get worse around bright lights or loud noises. Other symptoms may include: Feeling sick to your stomach (nauseous). Vomiting. Dizziness. Being sensitive to bright lights, loud noises, or smells. Before you get a migraine headache, you may get warning signs (an aura). An aura may include: Seeing flashing lights or having blind spots. Seeing bright spots, halos, or zigzag lines. Having tunnel vision or blurred vision. Having numbness or a tingling feeling. Having trouble talking. Having weak muscles. Some people have  symptoms after a migraine headache (postdromal phase), such as: Tiredness. Trouble thinking (concentrating). How is this treated? Taking medicines that: Relieve pain. Relieve the feeling of being sick to your stomach. Prevent migraine headaches. Treatment may also  include: Having acupuncture. Avoiding foods that bring on migraine headaches. Learning ways to control your body functions (biofeedback). Therapy to help you know and deal with negative thoughts (cognitive behavioral therapy). Follow these instructions at home: Medicines Take over-the-counter and prescription medicines only as told by your doctor. Ask your doctor if the medicine prescribed to you: Requires you to avoid driving or using heavy machinery. Can cause trouble pooping (constipation). You may need to take these steps to prevent or treat trouble pooping: Drink enough fluid to keep your pee (urine) pale yellow. Take over-the-counter or prescription medicines. Eat foods that are high in fiber. These include beans, whole grains, and fresh fruits and vegetables. Limit foods that are high in fat and sugar. These include fried or sweet foods. Lifestyle Do not drink alcohol. Do not use any products that contain nicotine or tobacco, such as cigarettes, e-cigarettes, and chewing tobacco. If you need help quitting, ask your doctor. Get at least 8 hours of sleep every night. Limit and deal with stress. General instructions   Keep a journal to find out what may bring on your migraine headaches. For example, write down: What you eat and drink. How much sleep you get. Any change in what you eat or drink. Any change in your medicines. If you have a migraine headache: Avoid things that make your symptoms worse, such as bright lights. It may help to lie down in a dark, quiet room. Do not drive or use heavy machinery. Ask your doctor what activities are safe for you. Keep all follow-up visits as told by your doctor. This is important. Contact a doctor if: You get a migraine headache that is different or worse than others you have had. You have more than 15 headache days in one month. Get help right away if: Your migraine headache gets very bad. Your migraine headache lasts longer than 72  hours. You have a fever. You have a stiff neck. You have trouble seeing. Your muscles feel weak or like you cannot control them. You start to lose your balance a lot. You start to have trouble walking. You pass out (faint). You have a seizure. Summary A migraine headache is a very strong throbbing pain on one side or both sides of your head. These headaches can also cause other symptoms. This condition may be treated with medicines and changes to your lifestyle. Keep a journal to find out what may bring on your migraine headaches. Contact a doctor if you get a migraine headache that is different or worse than others you have had. Contact your doctor if you have more than 15 headache days in a month. This information is not intended to replace advice given to you by your health care provider. Make sure you discuss any questions you have with your health care provider. Document Revised: 09/01/2018 Document Reviewed: 06/22/2018 Elsevier Patient Education  2022 [employer]  Form - Headache Record There are many types and causes of headaches. A headache record can help guide your treatment plan. Use this form to record the details. Bring this form with you to your follow-up visits. Follow your health care provider's instructions on how to describe your headache. You may be asked to: Use a pain scale. This is a tool to rate the intensity  of your headache using words or numbers. Describe what your headache feels like, such as dull, achy, throbbing, or sharp. Headache record Date: _______________ Time (from start to end): ____________________ Location of the headache: _________________________ Intensity of the headache: ____________________ Description of the headache: ______________________________________________________________ Hours of sleep the night before the headache: __________ Food or drinks before the headache started:  ______________________________________________________________________________________ Events before the headache started: _______________________________________________________________________________________________ Symptoms before the headache started: __________________________________________________________________________________________ Symptoms during the headache: __________________________________________________________________________________________________ Treatment: ________________________________________________________________________________________________________________ Effect of treatment: _________________________________________________________________________________________________________ Other comments: ___________________________________________________________________________________________________________ Date: _______________ Time (from start to end): ____________________ Location of the headache: _________________________ Intensity of the headache: ____________________ Description of the headache: ______________________________________________________________ Hours of sleep the night before the headache: __________ Food or drinks before the headache started: ______________________________________________________________________________________ Events before the headache started: ____________________________________________________________________________________________ Symptoms before the headache started: _________________________________________________________________________________________ Symptoms during the headache: _______________________________________________________________________________________________ Treatment: ________________________________________________________________________________________________________________ Effect of treatment:  _________________________________________________________________________________________________________ Other comments: ___________________________________________________________________________________________________________ Date: _______________ Time (from start to end): ____________________ Location of the headache: _________________________ Intensity of the headache: ____________________ Description of the headache: ______________________________________________________________ Hours of sleep the night before the headache: __________ Food or drinks before the headache started: ______________________________________________________________________________________ Events before the headache started: ____________________________________________________________________________________________ Symptoms before the headache started: _________________________________________________________________________________________ Symptoms during the headache: _______________________________________________________________________________________________ Treatment: ________________________________________________________________________________________________________________ Effect of treatment: _________________________________________________________________________________________________________ Other comments: ___________________________________________________________________________________________________________ Date: _______________ Time (from start to end): ____________________ Location of the headache: _________________________ Intensity of the headache: ____________________ Description of the headache: ______________________________________________________________ Hours of sleep the night before the headache: _________ Food or drinks before the headache started: ______________________________________________________________________________________ Events before the headache started:  ____________________________________________________________________________________________ Symptoms before the headache started: _________________________________________________________________________________________ Symptoms during the headache: _______________________________________________________________________________________________ Treatment: ________________________________________________________________________________________________________________ Effect of treatment: _________________________________________________________________________________________________________ Other comments: ___________________________________________________________________________________________________________ Date: _______________ Time (from start to end): ____________________ Location of the headache: _________________________ Intensity of the headache: ____________________ Description of the headache: ______________________________________________________________ Hours of sleep the night before the headache: _________ Food or drinks before the headache started: ______________________________________________________________________________________ Events before the headache started: ____________________________________________________________________________________________ Symptoms before the headache started: _________________________________________________________________________________________ Symptoms during the headache: _______________________________________________________________________________________________ Treatment: ________________________________________________________________________________________________________________ Effect of treatment: _________________________________________________________________________________________________________ Other comments: ___________________________________________________________________________________________________________ This  information is not intended to replace advice given to you by your health care provider. Make sure you discuss any questions you have with your health care provider. Document Revised: 05/29/2018 Document Reviewed: 05/29/2018 Elsevier Patient Education  2022 [employer].     I discussed the assessment and treatment plan with the patient. The patient was provided an opportunity to ask questions and all were answered. The patient agreed with the plan and demonstrated an understanding of the instructions.   The patient was advised to call back or seek an in-person evaluation if the symptoms worsen or if the condition fails to improve as anticipated.  I provided 23 minutes of non-face-to-face time during this encounter.   Ivonne Andrew, NP

## 2021-03-06 NOTE — Patient Instructions (Signed)
Chronic Headaches:  Most likely Migraine  Will order Imitrex  Will order Zofran for associated nausea  Please keep headache log  Follow up:  Follow up with PCP in 1 month or sooner if needed  Migraine Headache A migraine headache is a very strong throbbing pain on one side or both sides of your head. This type of headache can also cause other symptoms. It can last from 4 hours to 3 days. Talk with your doctor about what things may bring on (trigger) this condition. What are the causes? The exact cause of this condition is not known. This condition may be triggered or caused by: Drinking alcohol. Smoking. Taking medicines, such as: Medicine used to treat chest pain (nitroglycerin). Birth control pills. Estrogen. Some blood pressure medicines. Eating or drinking certain products. Doing physical activity. Other things that may trigger a migraine headache include: Having a menstrual period. Pregnancy. Hunger. Stress. Not getting enough sleep or getting too much sleep. Weather changes. Tiredness (fatigue). What increases the risk? Being 74-60 years old. Being female. Having a family history of migraine headaches. Being Caucasian. Having depression or anxiety. Being very overweight. What are the signs or symptoms? A throbbing pain. This pain may: Happen in any area of the head, such as on one side or both sides. Make it hard to do daily activities. Get worse with physical activity. Get worse around bright lights or loud noises. Other symptoms may include: Feeling sick to your stomach (nauseous). Vomiting. Dizziness. Being sensitive to bright lights, loud noises, or smells. Before you get a migraine headache, you may get warning signs (an aura). An aura may include: Seeing flashing lights or having blind spots. Seeing bright spots, halos, or zigzag lines. Having tunnel vision or blurred vision. Having numbness or a tingling feeling. Having trouble talking. Having  weak muscles. Some people have symptoms after a migraine headache (postdromal phase), such as: Tiredness. Trouble thinking (concentrating). How is this treated? Taking medicines that: Relieve pain. Relieve the feeling of being sick to your stomach. Prevent migraine headaches. Treatment may also include: Having acupuncture. Avoiding foods that bring on migraine headaches. Learning ways to control your body functions (biofeedback). Therapy to help you know and deal with negative thoughts (cognitive behavioral therapy). Follow these instructions at home: Medicines Take over-the-counter and prescription medicines only as told by your doctor. Ask your doctor if the medicine prescribed to you: Requires you to avoid driving or using heavy machinery. Can cause trouble pooping (constipation). You may need to take these steps to prevent or treat trouble pooping: Drink enough fluid to keep your pee (urine) pale yellow. Take over-the-counter or prescription medicines. Eat foods that are high in fiber. These include beans, whole grains, and fresh fruits and vegetables. Limit foods that are high in fat and sugar. These include fried or sweet foods. Lifestyle Do not drink alcohol. Do not use any products that contain nicotine or tobacco, such as cigarettes, e-cigarettes, and chewing tobacco. If you need help quitting, ask your doctor. Get at least 8 hours of sleep every night. Limit and deal with stress. General instructions   Keep a journal to find out what may bring on your migraine headaches. For example, write down: What you eat and drink. How much sleep you get. Any change in what you eat or drink. Any change in your medicines. If you have a migraine headache: Avoid things that make your symptoms worse, such as bright lights. It may help to lie down in a dark, quiet room.  Do not drive or use heavy machinery. Ask your doctor what activities are safe for you. Keep all follow-up visits as  told by your doctor. This is important. Contact a doctor if: You get a migraine headache that is different or worse than others you have had. You have more than 15 headache days in one month. Get help right away if: Your migraine headache gets very bad. Your migraine headache lasts longer than 72 hours. You have a fever. You have a stiff neck. You have trouble seeing. Your muscles feel weak or like you cannot control them. You start to lose your balance a lot. You start to have trouble walking. You pass out (faint). You have a seizure. Summary A migraine headache is a very strong throbbing pain on one side or both sides of your head. These headaches can also cause other symptoms. This condition may be treated with medicines and changes to your lifestyle. Keep a journal to find out what may bring on your migraine headaches. Contact a doctor if you get a migraine headache that is different or worse than others you have had. Contact your doctor if you have more than 15 headache days in a month. This information is not intended to replace advice given to you by your health care provider. Make sure you discuss any questions you have with your health care provider. Document Revised: 09/01/2018 Document Reviewed: 06/22/2018 Elsevier Patient Education  2022 [employer]  Form - Headache Record There are many types and causes of headaches. A headache record can help guide your treatment plan. Use this form to record the details. Bring this form with you to your follow-up visits. Follow your health care provider's instructions on how to describe your headache. You may be asked to: Use a pain scale. This is a tool to rate the intensity of your headache using words or numbers. Describe what your headache feels like, such as dull, achy, throbbing, or sharp. Headache record Date: _______________ Time (from start to end): ____________________ Location of the headache:  _________________________ Intensity of the headache: ____________________ Description of the headache: ______________________________________________________________ Hours of sleep the night before the headache: __________ Food or drinks before the headache started: ______________________________________________________________________________________ Events before the headache started: _______________________________________________________________________________________________ Symptoms before the headache started: __________________________________________________________________________________________ Symptoms during the headache: __________________________________________________________________________________________________ Treatment: ________________________________________________________________________________________________________________ Effect of treatment: _________________________________________________________________________________________________________ Other comments: ___________________________________________________________________________________________________________ Date: _______________ Time (from start to end): ____________________ Location of the headache: _________________________ Intensity of the headache: ____________________ Description of the headache: ______________________________________________________________ Hours of sleep the night before the headache: __________ Food or drinks before the headache started: ______________________________________________________________________________________ Events before the headache started: ____________________________________________________________________________________________ Symptoms before the headache started: _________________________________________________________________________________________ Symptoms during the headache:  _______________________________________________________________________________________________ Treatment: ________________________________________________________________________________________________________________ Effect of treatment: _________________________________________________________________________________________________________ Other comments: ___________________________________________________________________________________________________________ Date: _______________ Time (from start to end): ____________________ Location of the headache: _________________________ Intensity of the headache: ____________________ Description of the headache: ______________________________________________________________ Hours of sleep the night before the headache: __________ Food or drinks before the headache started: ______________________________________________________________________________________ Events before the headache started: ____________________________________________________________________________________________ Symptoms before the headache started: _________________________________________________________________________________________ Symptoms during the headache: _______________________________________________________________________________________________ Treatment: ________________________________________________________________________________________________________________ Effect of treatment: _________________________________________________________________________________________________________ Other comments: ___________________________________________________________________________________________________________ Date: _______________ Time (from start to end): ____________________ Location of the headache: _________________________ Intensity of the headache: ____________________ Description of the headache:  ______________________________________________________________ Hours of sleep the night before the headache: _________ Food or drinks before the headache started: ______________________________________________________________________________________ Events before the headache started: ____________________________________________________________________________________________ Symptoms before the headache started: _________________________________________________________________________________________ Symptoms during the headache: _______________________________________________________________________________________________ Treatment: ________________________________________________________________________________________________________________ Effect of treatment:  _________________________________________________________________________________________________________ Other comments: ___________________________________________________________________________________________________________ Date: _______________ Time (from start to end): ____________________ Location of the headache: _________________________ Intensity of the headache: ____________________ Description of the headache: ______________________________________________________________ Hours of sleep the night before the headache: _________ Food or drinks before the headache started: ______________________________________________________________________________________ Events before the headache started: ____________________________________________________________________________________________ Symptoms before the headache started: _________________________________________________________________________________________ Symptoms during the headache: _______________________________________________________________________________________________ Treatment:  ________________________________________________________________________________________________________________ Effect of treatment: _________________________________________________________________________________________________________ Other comments: ___________________________________________________________________________________________________________ This information is not intended to replace advice given to you by your health care provider. Make sure you discuss any questions you have with your health care provider. Document Revised: 05/29/2018 Document Reviewed: 05/29/2018 Elsevier Patient Education  2022 [employer].

## 2021-03-31 ENCOUNTER — Emergency Department (HOSPITAL_COMMUNITY)
Admission: EM | Admit: 2021-03-31 | Discharge: 2021-03-31 | Disposition: A | Discharge status: Home / Self Care | Payer: Medicaid Other | Attending: Emergency Medicine | Admitting: Emergency Medicine

## 2021-03-31 ENCOUNTER — Emergency Department (HOSPITAL_COMMUNITY): Payer: Medicaid Other

## 2021-03-31 DIAGNOSIS — Z20822 Contact with and (suspected) exposure to covid-19: Secondary | ICD-10-CM | POA: Diagnosis not present

## 2021-03-31 DIAGNOSIS — J069 Acute upper respiratory infection, unspecified: Secondary | ICD-10-CM | POA: Diagnosis not present

## 2021-03-31 DIAGNOSIS — B9789 Other viral agents as the cause of diseases classified elsewhere: Secondary | ICD-10-CM | POA: Diagnosis not present

## 2021-03-31 DIAGNOSIS — R059 Cough, unspecified: Secondary | ICD-10-CM | POA: Diagnosis not present

## 2021-03-31 DIAGNOSIS — R509 Fever, unspecified: Secondary | ICD-10-CM | POA: Diagnosis not present

## 2021-03-31 LAB — COMPREHENSIVE METABOLIC PANEL
ALT: 14 U/L (ref 0–44)
AST: 14 U/L — ABNORMAL LOW (ref 15–41)
Albumin: 4.4 g/dL (ref 3.5–5.0)
Alkaline Phosphatase: 63 U/L (ref 38–126)
Anion gap: 9 (ref 5–15)
BUN: 12 mg/dL (ref 6–20)
CO2: 24 mmol/L (ref 22–32)
Calcium: 9.6 mg/dL (ref 8.9–10.3)
Chloride: 106 mmol/L (ref 98–111)
Creatinine, Ser: 0.75 mg/dL (ref 0.44–1.00)
GFR, Estimated: >60 mL/min (ref >60)
Glucose, Bld: 95 mg/dL (ref 70–99)
Potassium: 3.6 mmol/L (ref 3.5–5.1)
Sodium: 139 mmol/L (ref 135–145)
Total Bilirubin: 1.6 mg/dL — ABNORMAL HIGH (ref 0.3–1.2)
Total Protein: 8 g/dL (ref 6.5–8.1)

## 2021-03-31 LAB — CBC
HCT: 43.2 % (ref 36.0–46.0)
Hemoglobin: 14.7 g/dL (ref 12.0–15.0)
MCH: 32 pg (ref 26.0–34.0)
MCHC: 34 g/dL (ref 30.0–36.0)
MCV: 94.1 fL (ref 80.0–100.0)
Platelets: 237 10*3/uL (ref 150–400)
RBC: 4.59 MIL/uL (ref 3.87–5.11)
RDW: 11.8 % (ref 11.5–15.5)
WBC: 6.3 10*3/uL (ref 4.0–10.5)
nRBC: 0 % (ref 0.0–0.2)

## 2021-03-31 LAB — GROUP A STREP BY PCR: Group A Strep by PCR: NOT DETECTED

## 2021-03-31 LAB — I-STAT BETA HCG BLOOD, ED (MC, WL, AP ONLY): I-stat hCG, quantitative: <5 m[IU]/mL (ref <5)

## 2021-03-31 LAB — RESP PANEL BY RT-PCR (FLU A&B, COVID) ARPGX2
Influenza A by PCR: NEGATIVE
Influenza B by PCR: NEGATIVE
SARS Coronavirus 2 by RT PCR: NEGATIVE

## 2021-03-31 LAB — LIPASE, BLOOD: Lipase: 34 U/L (ref 11–51)

## 2021-03-31 MED ORDER — DEXAMETHASONE SODIUM PHOSPHATE 10 MG/ML IJ SOLN
10.0000 mg | Freq: Once | INTRAMUSCULAR | Status: AC
  Administered 2021-03-31: 10 mg via INTRAVENOUS
  Filled 2021-03-31: qty 1

## 2021-03-31 MED ORDER — ONDANSETRON HCL 4 MG/2ML IJ SOLN
4.0000 mg | Freq: Once | INTRAMUSCULAR | Status: AC
  Administered 2021-03-31: 4 mg via INTRAVENOUS
  Filled 2021-03-31: qty 2

## 2021-03-31 MED ORDER — KETOROLAC TROMETHAMINE 15 MG/ML IJ SOLN
15.0000 mg | Freq: Once | INTRAMUSCULAR | Status: AC
  Administered 2021-03-31: 15 mg via INTRAVENOUS
  Filled 2021-03-31: qty 1

## 2021-03-31 MED ORDER — LIDOCAINE VISCOUS HCL 2 % MT SOLN
15.0000 mL | Freq: Once | OROMUCOSAL | Status: AC
  Administered 2021-03-31: 15 mL via OROMUCOSAL
  Filled 2021-03-31: qty 15

## 2021-03-31 MED ORDER — ACETAMINOPHEN 325 MG PO TABS
650.0000 mg | ORAL_TABLET | Freq: Once | ORAL | Status: AC
  Administered 2021-03-31: 650 mg via ORAL
  Filled 2021-03-31: qty 2

## 2021-03-31 MED ORDER — LACTATED RINGERS IV BOLUS
1000.0000 mL | Freq: Once | INTRAVENOUS | Status: AC
  Administered 2021-03-31: 1000 mL via INTRAVENOUS

## 2021-03-31 NOTE — ED Provider Notes (Signed)
[facility] EMERGENCY DEPARTMENT Provider Note   CSN: [id number] Arrival date & time: 03/31/21  4098     History Chief Complaint  Patient presents with   Sore Throat   Fever   Nausea   Emesis    Elizabeth Harrington is a 20 y.o. female.  The history is provided by the patient.  URI Presenting symptoms: congestion, cough, fever and sore throat   Presenting symptoms comment:  Vomiting Severity:  Moderate Onset quality:  Gradual Duration:  3 days Timing:  Constant Progression:  Worsening Chronicity:  New Relieved by:  Nothing Exacerbated by: swallowing. Ineffective treatments: tylenol. Associated symptoms: myalgias   Associated symptoms comment:  No SOB but did note when vomiting this morning there was some streaks of blood in it. Risk factors: sick contacts       Past Medical History:  Diagnosis Date   No pertinent past medical history     Patient Active Problem List   Diagnosis Date Noted   Rash and nonspecific skin eruption 10/01/2019   Adjustment disorder with depressed mood 11/13/2015    Past Surgical History:  Procedure Laterality Date   WISDOM TOOTH EXTRACTION       OB History     Gravida  0   Para  0   Term  0   Preterm  0   AB  0   Living         SAB  0   IAB  0   Ectopic  0   Multiple      Live Births              Family History  Problem Relation Age of Onset   Cancer Mother     Social History   Tobacco Use   Smoking status: Never   Smokeless tobacco: Never  Vaping Use   Vaping Use: Never used  Substance Use Topics   Alcohol use: Never    Alcohol/week: 0.0 standard drinks   Drug use: Never    Home Medications Prior to Admission medications   Medication Sig Start Date End Date Taking? Authorizing Provider  ibuprofen (ADVIL) 200 MG tablet Take 400 mg by mouth every 6 (six) hours as needed for fever or headache (pain).    [provider]  lidocaine (XYLOCAINE) 2 % solution Use as  directed 2 mLs in the mouth or throat as needed for mouth pain. 05/30/20   Maxwell Caul, PA-C  menthol-cetylpyridinium (CEPACOL) 3 MG lozenge Take 1 lozenge by mouth as needed for sore throat.    [provider]  ondansetron (ZOFRAN ODT) 4 MG disintegrating tablet Take 1 tablet (4 mg total) by mouth every 8 (eight) hours as needed for nausea or vomiting. 03/06/21   Ivonne Andrew, NP  Phenylephrine-DM-GG-APAP (MUCINEX FAST-MAX CLD FLU THRT) 5-10-200-325 MG TABS Take 2 capsules by mouth 2 (two) times daily as needed (sore throat).    [provider]  SUMAtriptan (IMITREX) 25 MG tablet Take 1 tablet (25 mg total) by mouth every 2 (two) hours as needed for migraine. May repeat in 2 hours if headache persists or recurs. 03/06/21   Ivonne Andrew, NP    Allergies    Patient has no known allergies.  Review of Systems   Review of Systems  Constitutional:  Positive for fever.  HENT:  Positive for congestion and sore throat.   Respiratory:  Positive for cough.   Musculoskeletal:  Positive for myalgias.  All other systems  reviewed and are negative.  Physical Exam Updated Vital Signs BP 105/71   Pulse 79   Temp 98.2 F (36.8 C) (Oral)   Resp 15   LMP 03/31/2021   SpO2 99%   Physical Exam Vitals and nursing note reviewed.  Constitutional:      General: She is not in acute distress.    Appearance: She is well-developed.     Comments: Spitting saliva in a bag  HENT:     Head: Normocephalic and atraumatic.     Right Ear: Tympanic membrane normal.     Left Ear: Tympanic membrane normal.     Nose: Congestion present.     Mouth/Throat:     Mouth: Mucous membranes are moist. No oral lesions.     Pharynx: Posterior oropharyngeal erythema present. No pharyngeal swelling, oropharyngeal exudate or uvula swelling.     Tonsils: No tonsillar exudate.  Eyes:     Conjunctiva/sclera: Conjunctivae normal.     Pupils: Pupils are equal, round, and reactive to light.   Cardiovascular:     Rate and Rhythm: Normal rate and regular rhythm.     Pulses: Normal pulses.     Heart sounds: Normal heart sounds. No murmur heard.   No friction rub.  Pulmonary:     Effort: Pulmonary effort is normal.     Breath sounds: Normal breath sounds. No wheezing or rales.  Abdominal:     General: Bowel sounds are normal. There is no distension.     Palpations: Abdomen is soft.     Tenderness: There is no abdominal tenderness. There is no guarding or rebound.  Musculoskeletal:        General: No tenderness. Normal range of motion.     Cervical back: Normal range of motion and neck supple.     Right lower leg: No edema.     Left lower leg: No edema.     Comments: No edema  Lymphadenopathy:     Cervical: No cervical adenopathy.  Skin:    General: Skin is warm and dry.     Findings: No rash.  Neurological:     Mental Status: She is alert and oriented to person, place, and time. Mental status is at baseline.     Cranial Nerves: No cranial nerve deficit.  Psychiatric:        Mood and Affect: Mood normal.        Behavior: Behavior normal.    ED Results / Procedures / Treatments   Labs (all labs ordered are listed, but only abnormal results are displayed) Labs Reviewed  COMPREHENSIVE METABOLIC PANEL - Abnormal; Notable for the following components:      Result Value   AST 14 (*)    Total Bilirubin 1.6 (*)    All other components within normal limits  GROUP A STREP BY PCR  RESP PANEL BY RT-PCR (FLU A&B, COVID) ARPGX2  LIPASE, BLOOD  CBC  URINALYSIS, ROUTINE W REFLEX MICROSCOPIC  I-STAT BETA HCG BLOOD, ED ([facility], [facility], [facility] ONLY)    EKG None  Radiology DG Chest Port 1 View  Result Date: 03/31/2021 CLINICAL DATA:  Cough, fever. EXAM: PORTABLE CHEST 1 VIEW COMPARISON:  July 03, 2018. FINDINGS: The heart size and mediastinal contours are within normal limits. Both lungs are clear. The visualized skeletal structures are unremarkable. IMPRESSION: No active disease.  Electronically Signed   By: Lupita Raider M.D.   On: 03/31/2021 11:43    Procedures Procedures   Medications Ordered in ED Medications  ketorolac (TORADOL) 15 MG/ML injection 15 mg (has no administration in time range)  dexamethasone (DECADRON) injection 10 mg (has no administration in time range)  acetaminophen (TYLENOL) tablet 650 mg (650 mg Oral Given 03/31/21 0738)  lidocaine (XYLOCAINE) 2 % viscous mouth solution 15 mL (15 mLs Mouth/Throat Given 03/31/21 1210)  lactated ringers bolus 1,000 mL (1,000 mLs Intravenous New Bag/Given 03/31/21 1210)  ondansetron (ZOFRAN) injection 4 mg (4 mg Intravenous Given 03/31/21 1210)    ED Course  I have reviewed the triage vital signs and the nursing notes.  Pertinent labs & imaging results that were available during my care of the patient were reviewed by me and considered in my medical decision making (see chart for details).    MDM Rules/Calculators/A&P                           Pt with symptoms concerning for flu/covid or other viral etiology.  Severe sore throat but no edema or exudates but very erythematous.  Strep is neg.  Afebrile here.  No signs of breathing difficulty  No signs of strep pharyngitis, otitis or abnormal abdominal findings.   CXR and viral testing pending.  Will give IVF as pt is still nauseated and has been vomiting.  No abd pain at this time to suggest abd pathology.  Pt did have some blood streaks in emesis today but most likely mallory weiss tear.  Will give viscous lidocaine for sorethroat. 12:43 PM Flu and COVID are negative.  Chest x-ray within normal limits.  Will continue antipyretica and rest and fluids and return for any further problems.  MDM   Amount and/or Complexity of Data Reviewed Clinical lab tests: ordered and reviewed Tests in the radiology section of CPT: ordered and reviewed Independent visualization of images, tracings, or specimens: yes    Final Clinical Impression(s) / ED Diagnoses Final  diagnoses:  Viral upper respiratory tract infection    Rx / DC Orders ED Discharge Orders     None        Gwyneth Sprout, MD 03/31/21 1244

## 2021-03-31 NOTE — ED Notes (Signed)
Sitting up in stretcher, c/o throat pain but no distress, airway patent. Urine prompted. Covid swab obtained.

## 2021-03-31 NOTE — Discharge Instructions (Addendum)
No strep, flu or covid today.  You have a virus that is causing the sore throat.  You can try the Cepacol spray or tylenol/motrin for the pain.  You should start feeling better over the next 2-3 days.  Get plenty of rest and drink fluids.

## 2021-03-31 NOTE — ED Triage Notes (Signed)
Pt. Stated, ave had a fever sore throat, N/V since Sunday night.

## 2021-03-31 NOTE — ED Notes (Signed)
Pt verbalized understanding of d/c instructions, meds and followup care. Denies questions. VSS, no distress noted. Steady gait to exit with all belongings.  ?

## 2021-04-03 NOTE — Progress Notes (Signed)
Virtual Visit via Telephone Note  I connected with Elizabeth Harrington, on 04/07/2021 at 3:16 PM by telephone due to the COVID-19 pandemic and verified that I am speaking with the correct person using two identifiers.  Due to current restrictions/limitations of in-office visits due to the COVID-19 pandemic, this scheduled clinical appointment was converted to a telehealth visit.   Consent: I discussed the limitations, risks, security and privacy concerns of performing an evaluation and management service by telephone and the availability of in person appointments. I also discussed with the patient that there may be a patient responsible charge related to this service. The patient expressed understanding and agreed to proceed.   Location of Patient: Home  Location of Provider: [facility]   Persons participating in Telemedicine visit: Yanett Malpica [facility], NP Margorie John, CMA   History of Present Illness: Subjective: Elizabeth Harrington is a 20 y.o. female who presents for headaches follow-up.   Her concerns today include:   HEADACHES FOLLOW-UP: 03/06/2021 with Angus Seller, NP: Most likely Migraine Will order Imitrex Will order Zofran for associated nausea Please keep headache log Follow up: Follow up with PCP in 1 month or sooner if needed  04/07/2021: Reports Imitrex and Zofran helping. Reports only 2 headaches since last appointment. Reports history of having intense headaches lasting 1 week and happens about once monthly. When having intense headaches also has nausea and heart pounding.   2. HOSPITAL DISCHARGE FOLLOW-UP: 03/31/2021 at [facility] Emergency Department per MD note: Pt with symptoms concerning for flu/covid or other viral etiology.  Severe sore throat but no edema or exudates but very erythematous.  Strep is neg.  Afebrile here.  No signs of breathing difficulty  No signs of strep pharyngitis,  otitis or abnormal abdominal findings.   CXR and viral testing pending.  Will give IVF as pt is still nauseated and has been vomiting.  No abd pain at this time to suggest abd pathology.  Pt did have some blood streaks in emesis today but most likely mallory weiss tear.  Will give viscous lidocaine for sorethroat. 12:43 PM Flu and COVID are negative.  Chest x-ray within normal limits. Will continue antipyretica and rest and fluids and return for any further problems.   MDM Amount and/or Complexity of Data Reviewed Clinical lab tests: ordered and reviewed Tests in the radiology section of CPT: ordered and reviewed Independent visualization of images, tracings, or specimens: yes   04/07/2021: Since hospital discharge feeling better. Still has mild nasal and ear congestion. Overall feeling improved.     Past Medical History:  Diagnosis Date   No pertinent past medical history    No Known Allergies  Current Outpatient Medications on File Prior to Visit  Medication Sig Dispense Refill   ibuprofen (ADVIL) 200 MG tablet Take 400 mg by mouth every 6 (six) hours as needed for fever or headache (pain).     lidocaine (XYLOCAINE) 2 % solution Use as directed 2 mLs in the mouth or throat as needed for mouth pain. 15 mL 0   menthol-cetylpyridinium (CEPACOL) 3 MG lozenge Take 1 lozenge by mouth as needed for sore throat.     ondansetron (ZOFRAN ODT) 4 MG disintegrating tablet Take 1 tablet (4 mg total) by mouth every 8 (eight) hours as needed for nausea or vomiting. 6 tablet 0   Phenylephrine-DM-GG-APAP (MUCINEX FAST-MAX CLD FLU THRT) 5-10-200-325 MG TABS Take 2 capsules by mouth 2 (two) times daily as  needed (sore throat).     SUMAtriptan (IMITREX) 25 MG tablet Take 1 tablet (25 mg total) by mouth every 2 (two) hours as needed for migraine. May repeat in 2 hours if headache persists or recurs. 10 tablet 0   No current facility-administered medications on file prior to visit.     Observations/Objective: Alert and oriented x 3. Not in acute distress. Physical examination not completed as this is a telemedicine visit.  Assessment and Plan: 1. Chronic nonintractable headache, unspecified headache type: - Continue Sumatriptan and Ondansetron as prescribed.  - Follow-up with primary provider as scheduled. - ondansetron (ZOFRAN ODT) 4 MG disintegrating tablet; Take 1 tablet (4 mg total) by mouth every 8 (eight) hours as needed for nausea or vomiting.  Dispense: 20 tablet; Refill: 0 - SUMAtriptan (IMITREX) 25 MG tablet; Take 1 tablet (25 mg total) at the start of the headache. May repeat in 2 hours x 1 if headache persists. Max of 2 tablets/24 hours.  Dispense: 30 tablet; Refill: 0  2. Hospital discharge follow-up: - Reviewed hospital course, current medications, ensured proper follow-up in place, and addressed concerns.   3. Viral upper respiratory tract infection: - Resolved since hospital discharge. - Follow-up with primary provider as scheduled.   Follow Up Instructions: Follow-up with primary provider as scheduled.   Patient was given clear instructions to go to Emergency Department or return to medical center if symptoms don't improve, worsen, or new problems develop.The patient verbalized understanding.  I discussed the assessment and treatment plan with the patient. The patient was provided an opportunity to ask questions and all were answered. The patient agreed with the plan and demonstrated an understanding of the instructions.   The patient was advised to call back or seek an in-person evaluation if the symptoms worsen or if the condition fails to improve as anticipated.    I provided 15 minutes total of non-face-to-face time during this encounter.   Rema Fendt, NP  [facility] [place], [place] [phone] 04/07/2021, 3:16 PM

## 2021-04-07 ENCOUNTER — Other Ambulatory Visit: Payer: Self-pay

## 2021-04-07 ENCOUNTER — Encounter: Payer: Self-pay | Admitting: Family

## 2021-04-07 ENCOUNTER — Telehealth (INDEPENDENT_AMBULATORY_CARE_PROVIDER_SITE_OTHER): Payer: Medicaid Other | Admitting: Family

## 2021-04-07 DIAGNOSIS — R519 Headache, unspecified: Secondary | ICD-10-CM | POA: Diagnosis not present

## 2021-04-07 DIAGNOSIS — G8929 Other chronic pain: Secondary | ICD-10-CM | POA: Diagnosis not present

## 2021-04-07 DIAGNOSIS — Z09 Encounter for follow-up examination after completed treatment for conditions other than malignant neoplasm: Secondary | ICD-10-CM

## 2021-04-07 DIAGNOSIS — J069 Acute upper respiratory infection, unspecified: Secondary | ICD-10-CM

## 2021-04-07 MED ORDER — SUMATRIPTAN SUCCINATE 25 MG PO TABS: ORAL_TABLET | ORAL | 0 refills | Status: DC

## 2021-04-07 MED ORDER — ONDANSETRON 4 MG PO TBDP: 4.0000 mg | ORAL_TABLET | Freq: Three times a day (TID) | ORAL | 0 refills | Status: DC | PRN

## 2021-04-07 NOTE — Progress Notes (Signed)
Pt presents for telemedicine visit for follow-up for new headache medication

## 2022-06-21 ENCOUNTER — Ambulatory Visit: Payer: Self-pay

## 2022-06-21 NOTE — Telephone Encounter (Signed)
     Chief Complaint: Rectal bleeding , comes and goes Symptoms: Above Frequency: June last year Pertinent Negatives: Patient denies  Disposition: [] ED /[] Urgent Care (no appt availability in office) / [x] Appointment(In office/virtual)/ []  [place] [facility]/ [] Home Care/ [] Refused Recommended Disposition /[] [place] [facility]/ []  Follow-up with PCP Additional Notes:   Reason for Disposition . MODERATE rectal bleeding (small blood clots, passing blood without stool, or toilet water turns red)  Answer Assessment - Initial Assessment Questions 1. APPEARANCE of BLOOD: "What color is it?" "Is it passed separately, on the surface of the stool, or mixed in with the stool?"      Bright red, mixed with stools 2. AMOUNT: "How much blood was passed?"      Largge 3. FREQUENCY: "How many times has blood been passed with the stools?"      Comes and goes 4. ONSET: "When was the blood first seen in the stools?" (Days or weeks)      Last JUne 5. DIARRHEA: "Is there also some diarrhea?" If Yes, ask: "How many diarrhea stools in the past 24 hours?"      At times  6. CONSTIPATION: "Do you have constipation?" If Yes, ask: "How bad is it?"     At times 7. RECURRENT SYMPTOMS: "Have you had blood in your stools before?" If Yes, ask: "When was the last time?" and "What happened that time?"      Yes 8. BLOOD THINNERS: "Do you take any blood thinners?" (e.g., Coumadin/warfarin, Pradaxa/dabigatran, aspirin)     No 9. OTHER SYMPTOMS: "Do you have any other symptoms?"  (e.g., abdomen pain, vomiting, dizziness, fever)     No 10. PREGNANCY: "Is there any chance you are pregnant?" "When was your last menstrual period?"       No  Protocols used: Rectal Bleeding-A-AH

## 2022-06-22 NOTE — Progress Notes (Unsigned)
Patient ID: Elizabeth Harrington, female    DOB: 05/19/2001  MRN: [id number]  CC: Rectal Bleeding   Subjective: Elizabeth Harrington is a 22 y.o. female who presents for rectal bleeding.   Her concerns today include:  06/21/2022 per triage RN note: Chief Complaint: Rectal bleeding , comes and goes Symptoms: Above Frequency: June last year Pertinent Negatives: Patient denies  Disposition: [] ED /[] Urgent Care (no appt availability in office) / [x] Appointment(In office/virtual)/ []  [place] [facility]/ [] Home Care/ [] Refused Recommended Disposition /[] [facility] [facility]/ []  Follow-up with PCP Additional Notes:   Reason for Disposition  MODERATE rectal bleeding (small blood clots, passing blood without stool, or toilet water turns red)  Answer Assessment - Initial Assessment Questions 1. APPEARANCE of BLOOD: "What color is it?" "Is it passed separately, on the surface of the stool, or mixed in with the stool?"      Bright red, mixed with stools 2. AMOUNT: "How much blood was passed?"      Largge 3. FREQUENCY: "How many times has blood been passed with the stools?"      Comes and goes 4. ONSET: "When was the blood first seen in the stools?" (Days or weeks)      Last JUne 5. DIARRHEA: "Is there also some diarrhea?" If Yes, ask: "How many diarrhea stools in the past 24 hours?"      At times  6. CONSTIPATION: "Do you have constipation?" If Yes, ask: "How bad is it?"     At times 7. RECURRENT SYMPTOMS: "Have you had blood in your stools before?" If Yes, ask: "When was the last time?" and "What happened that time?"      Yes 8. BLOOD THINNERS: "Do you take any blood thinners?" (e.g., Coumadin/warfarin, Pradaxa/dabigatran, aspirin)     No 9. OTHER SYMPTOMS: "Do you have any other symptoms?"  (e.g., abdomen pain, vomiting, dizziness, fever)     No 10. PREGNANCY: "Is there any chance you are pregnant?" "When was your last menstrual period?"  Today's visit 06/23/2022: -  Patient's rectal bleeding report today consistent with triage call. Last occurrence last week.  - Migraines occur monthly and last for about 7 days at a time. Thinks related to menses. Misses days from school related to migraines. She tried prescribed medications in the past which did not help and caused side effects. Endorses nausea. Needs Zofran refills. Coffee helps. Denies red flag symptoms.   Patient Active Problem List   Diagnosis Date Noted   Rash and nonspecific skin eruption 10/01/2019   Adjustment disorder with depressed mood 11/13/2015     Current Outpatient Medications on File Prior to Visit  Medication Sig Dispense Refill   ibuprofen (ADVIL) 200 MG tablet Take 400 mg by mouth every 6 (six) hours as needed for fever or headache (pain). (Patient not taking: Reported on 06/23/2022)     lidocaine (XYLOCAINE) 2 % solution Use as directed 2 mLs in the mouth or throat as needed for mouth pain. (Patient not taking: Reported on 06/23/2022) 15 mL 0   menthol-cetylpyridinium (CEPACOL) 3 MG lozenge Take 1 lozenge by mouth as needed for sore throat. (Patient not taking: Reported on 06/23/2022)     Phenylephrine-DM-GG-APAP (MUCINEX FAST-MAX CLD FLU THRT) 5-10-200-325 MG TABS Take 2 capsules by mouth 2 (two) times daily as needed (sore throat). (Patient not taking: Reported on 06/23/2022)     No current facility-administered medications on file prior to visit.    Allergies  Allergen Reactions   Sumatriptan Anaphylaxis  Social History   Socioeconomic History   Marital status: Single    Spouse name: Not on file   Number of children: Not on file   Years of education: Not on file   Highest education level: Not on file  Occupational History   Not on file  Tobacco Use   Smoking status: Never   Smokeless tobacco: Never  Vaping Use   Vaping Use: Never used  Substance and Sexual Activity   Alcohol use: Never    Alcohol/week: 0.0 standard drinks of alcohol   Drug use: Never   Sexual  activity: Yes    Birth control/protection: Condom  Other Topics Concern   Not on file  Social History Narrative   ** Merged History Encounter **       Social Determinants of Health   Financial Resource Strain: Not on file  Food Insecurity: Not on file  Transportation Needs: Not on file  Physical Activity: Unknown (05/05/2020)   Exercise Vital Sign    Days of Exercise per Week: 0 days    Minutes of Exercise per Session: Not on file  Stress: Not on file  Social Connections: Not on file  Intimate Partner Violence: Not on file    Family History  Problem Relation Age of Onset   Cancer Mother     Past Surgical History:  Procedure Laterality Date   WISDOM TOOTH EXTRACTION      ROS: Review of Systems Negative except as stated above  PHYSICAL EXAM: BP 109/67 (BP Location: Left Arm, Patient Position: Sitting, Cuff Size: Normal)   Pulse 88   Ht 4\' 11"  (1.499 m)   Wt 121 lb (54.9 kg)   LMP 06/09/2022   SpO2 98%   BMI 24.44 kg/m   Physical Exam HENT:     Head: Normocephalic and atraumatic.  Eyes:     Extraocular Movements: Extraocular movements intact.     Conjunctiva/sclera: Conjunctivae normal.     Pupils: Pupils are equal, round, and reactive to light.  Cardiovascular:     Rate and Rhythm: Normal rate and regular rhythm.     Pulses: Normal pulses.     Heart sounds: Normal heart sounds.  Pulmonary:     Effort: Pulmonary effort is normal.     Breath sounds: Normal breath sounds.  Musculoskeletal:     Cervical back: Normal range of motion and neck supple.  Neurological:     General: No focal deficit present.     Mental Status: She is alert and oriented to person, place, and time.  Psychiatric:        Mood and Affect: Mood normal.        Behavior: Behavior normal.     ASSESSMENT AND PLAN: 1. Rectal bleeding 2. Diarrhea, unspecified type 3. Constipation, unspecified constipation type - Referral to Gastroenterology for further evaluation/management. -  Ambulatory referral to Gastroenterology  4. Chronic nonintractable headache, unspecified headache type - Continue Ondansetron as prescribed.  - Referral to Neurology for further evaluation/management.  - ondansetron (ZOFRAN ODT) 4 MG disintegrating tablet; Take 1 tablet (4 mg total) by mouth every 8 (eight) hours as needed for nausea or vomiting.  Dispense: 30 tablet; Refill: 2 - Ambulatory referral to Neurology   Patient was given the opportunity to ask questions.  Patient verbalized understanding of the plan and was able to repeat key elements of the plan. Patient was given clear instructions to go to Emergency Department or return to medical center if symptoms don't improve, worsen, or new problems  develop.The patient verbalized understanding.   Orders Placed This Encounter  Procedures   Ambulatory referral to Gastroenterology   Ambulatory referral to Neurology    Requested Prescriptions   Signed Prescriptions Disp Refills   ondansetron (ZOFRAN ODT) 4 MG disintegrating tablet 30 tablet 2    Sig: Take 1 tablet (4 mg total) by mouth every 8 (eight) hours as needed for nausea or vomiting.    Follow-up with primary provider as scheduled.   Camillia Herter, NP

## 2022-06-23 ENCOUNTER — Encounter: Payer: Self-pay | Admitting: Family

## 2022-06-23 ENCOUNTER — Ambulatory Visit (INDEPENDENT_AMBULATORY_CARE_PROVIDER_SITE_OTHER): Payer: Medicaid Other | Admitting: Family

## 2022-06-23 VITALS — BP 109/67 | HR 88 | Ht 59.0 in | Wt 121.0 lb

## 2022-06-23 DIAGNOSIS — R519 Headache, unspecified: Secondary | ICD-10-CM

## 2022-06-23 DIAGNOSIS — R197 Diarrhea, unspecified: Secondary | ICD-10-CM

## 2022-06-23 DIAGNOSIS — K625 Hemorrhage of anus and rectum: Secondary | ICD-10-CM

## 2022-06-23 DIAGNOSIS — G8929 Other chronic pain: Secondary | ICD-10-CM

## 2022-06-23 DIAGNOSIS — K5900 Constipation, unspecified: Secondary | ICD-10-CM | POA: Diagnosis not present

## 2022-06-23 MED ORDER — ONDANSETRON 4 MG PO TBDP: 4.0000 mg | ORAL_TABLET | Freq: Three times a day (TID) | ORAL | 2 refills | Status: AC | PRN

## 2022-06-23 NOTE — Progress Notes (Signed)
Patient c/o of Frequent migraines along with nausea and vomiting, would like to discuss medication change,  Rectal bleeding during bowel movement

## 2022-06-23 NOTE — Patient Instructions (Signed)
Rectal Bleeding  Rectal bleeding is when blood comes out of the opening of the butt (anus). People with this kind of bleeding may notice bright red blood in their underwear or in the toilet after they poop (have a bowel movement). They may also have blood mixed with their poop (stool), or dark red or black poop. Rectal bleeding is often a sign that something is wrong. This condition can be caused by many things. It needs to be checked by a doctor. Your doctor will do tests to know what is causing your condition. Follow these instructions at home: Watch for any changes in your condition. Take these actions to help with bleeding and discomfort: Medicines Take over-the-counter and prescription medicines only as told by your doctor. Ask your doctor about changing or stopping your normal medicines. This is important if you are taking blood thinners. Medicines that thin the blood can make rectal bleeding worse. Managing constipation Your condition may cause trouble pooping (constipation). To prevent or treat trouble pooping, or to help make your poop soft, you may need to: Drink enough fluid to keep your pee (urine) pale yellow. Take over-the-counter or prescription medicines. Eat foods that are high in fiber. These include beans, whole grains, and fresh fruits and vegetables. Limit foods that are high in fat and sugar. These include fried or sweet foods.  General instructions Try not to strain when you poop. Try taking a warm bath. This may help with pain. Keep all follow-up visits as told by your doctor. This is important. Contact a doctor if: You have pain or swelling in your belly (abdomen). You have a fever. You feel weak. You feel like you may vomit. You cannot poop. Get help right away if: You have new bleeding. You have more bleeding than before. You have black or dark red poop. You vomit blood or something that looks like coffee grounds. You pass out (faint). You have very bad pain  in your butt. Summary Rectal bleeding is when blood comes out of the opening of the butt. This bleeding is often a sign that something is wrong. Eat a diet that is high in fiber. This will help to keep your poop soft. Talk to your doctor if you take medicines that thin the blood. These medicines can make bleeding worse. Get help right away if you have new or more bleeding, black or dark red poop, or blood in your vomit. Also, get help if you pass out or have very bad pain in your butt. This information is not intended to replace advice given to you by your health care provider. Make sure you discuss any questions you have with your health care provider. Document Revised: 04/11/2019 Document Reviewed: 04/11/2019 Elsevier Patient Education  2023 Elsevier Inc.  

## 2022-08-06 ENCOUNTER — Ambulatory Visit: Payer: Medicaid Other | Admitting: Physician Assistant

## 2022-08-17 NOTE — Progress Notes (Deleted)
Referring:  Camillia Herter, NP [address] [place],  [place] [address]  PCP: Camillia Herter, NP  Neurology was asked to evaluate Elizabeth Harrington, a 22 year old female for a chief complaint of headaches.  Our recommendations of care will be communicated by shared medical record.    CC:  headaches  History provided from ***  HPI:  Medical co-morbidities: ***  The patient presents for evaluation of headaches which began***  Headache History: Onset: Triggers: Aura: Location: Quality/Description: Associated Symptoms:  Photophobia:  Phonophobia:  Nausea: Vomiting: Allodynia: Other symptoms: Worse with activity?: Duration of headaches:  Headache days per month: *** Migraine days per month: *** Headache free days per month: ***  Current Treatment: Abortive ***  Preventative ***  Prior Therapies                                 Imitrex 25 mg PRN Zofran 4 mg PRN   LABS: ***  IMAGING:  MRI brain 12/11/19: Examination limited by significant susceptibility artifact arising from dental braces. Additionally, the patient declined the administration of intravenous contrast and post-contrast imaging.   Within described limitations, there is an unremarkable MRI appearance of the brain. No evidence of acute intracranial abnormality.  ***Imaging independently reviewed on August 17, 2022   Current Outpatient Medications on File Prior to Visit  Medication Sig Dispense Refill   ibuprofen (ADVIL) 200 MG tablet Take 400 mg by mouth every 6 (six) hours as needed for fever or headache (pain). (Patient not taking: Reported on 06/23/2022)     lidocaine (XYLOCAINE) 2 % solution Use as directed 2 mLs in the mouth or throat as needed for mouth pain. (Patient not taking: Reported on 06/23/2022) 15 mL 0   menthol-cetylpyridinium (CEPACOL) 3 MG lozenge Take 1 lozenge by mouth as needed for sore throat. (Patient not taking: Reported on 06/23/2022)     ondansetron  (ZOFRAN ODT) 4 MG disintegrating tablet Take 1 tablet (4 mg total) by mouth every 8 (eight) hours as needed for nausea or vomiting. 30 tablet 2   Phenylephrine-DM-GG-APAP (MUCINEX FAST-MAX CLD FLU THRT) 5-10-200-325 MG TABS Take 2 capsules by mouth 2 (two) times daily as needed (sore throat). (Patient not taking: Reported on 06/23/2022)     No current facility-administered medications on file prior to visit.     Allergies: Allergies  Allergen Reactions   Sumatriptan Anaphylaxis    Family History: Migraine or other headaches in the family:  *** Aneurysms in a first degree relative:  *** Brain tumors in the family:  *** Other neurological illness in the family:   ***  Past Medical History: Past Medical History:  Diagnosis Date   Migraine    No pertinent past medical history     Past Surgical History Past Surgical History:  Procedure Laterality Date   WISDOM TOOTH EXTRACTION      Social History: Social History   Tobacco Use   Smoking status: Never   Smokeless tobacco: Never  Vaping Use   Vaping Use: Never used  Substance Use Topics   Alcohol use: Never    Alcohol/week: 0.0 standard drinks of alcohol   Drug use: Never   ***  ROS: Negative for fevers, chills. Positive for***. All other systems reviewed and negative unless stated otherwise in HPI.   Physical Exam:   Vital Signs: There were no vitals taken for this visit. GENERAL: well appearing,in no acute  distress,alert SKIN:  Color, texture, turgor normal. No rashes or lesions HEAD:  Normocephalic/atraumatic. CV:  RRR RESP: Normal respiratory effort MSK: no tenderness to palpation over occiput, neck, or shoulders  NEUROLOGICAL: Mental Status: Alert, oriented to person, place and time,Follows commands Cranial Nerves: PERRL, visual fields intact to confrontation, extraocular movements intact, facial sensation intact, no facial droop or ptosis, hearing grossly intact, no dysarthria, palate elevate  symmetrically, tongue protrudes midline, shoulder shrug intact and symmetric Motor: muscle strength 5/5 both upper and lower extremities,no drift, normal tone Reflexes: 2+ throughout Sensation: intact to light touch all 4 extremities Coordination: Finger-to- nose-finger intact bilaterally Gait: normal-based   IMPRESSION: ***  PLAN: ***   I spent a total of *** minutes chart reviewing and counseling the patient. Headache education was done. Discussed treatment options including preventive and acute medications, natural supplements, and physical therapy. Discussed medication overuse headache and to limit use of acute treatments to no more than 2 days/week or 10 days/month. Discussed medication side effects, adverse reactions and drug interactions. Written educational materials and patient instructions outlining all of the above were given.  Follow-up: ***   Genia Harold, MD 08/17/2022   12:58 PM

## 2022-08-18 ENCOUNTER — Ambulatory Visit: Payer: Medicaid Other | Admitting: Psychiatry

## 2022-08-25 ENCOUNTER — Ambulatory Visit: Payer: Medicaid Other | Admitting: Psychiatry

## 2022-08-25 ENCOUNTER — Encounter: Payer: Self-pay | Admitting: Psychiatry

## 2022-08-25 VITALS — BP 96/65 | HR 67 | Ht 59.0 in | Wt 118.0 lb

## 2022-08-25 DIAGNOSIS — G43009 Migraine without aura, not intractable, without status migrainosus: Secondary | ICD-10-CM | POA: Diagnosis not present

## 2022-08-25 MED ORDER — UBRELVY 100 MG PO TABS: 100.0000 mg | ORAL_TABLET | ORAL | 6 refills | Status: AC | PRN

## 2022-08-25 NOTE — Patient Instructions (Signed)
Start [place] as needed for migraines. Take 1 pill at onset of migraine. May repeat a dose in 2 hours if needed. Max dose is 2 pills in 24 hours. We will submit some paperwork to your insurance to get this medication approved. We will let you know once your insurance has approved it.

## 2022-08-25 NOTE — Progress Notes (Signed)
Referring:  Camillia Herter, NP [address] [place],  [place] [address]  PCP: Camillia Herter, NP  Neurology was asked to evaluate Elizabeth Harrington, a 22 year old female for a chief complaint of headaches.  Our recommendations of care will be communicated by shared medical record.    CC:  headaches  History provided from self  HPI:  Medical co-morbidities: none  The patient presents for evaluation of headaches which began when she was 22 years old. Headaches are associated with photophobia, nausea, and vomiting. She averages ~1 migraine per month, but it can last up to 1-2 weeks at a time. Previously tried Imitrex for rescue, but felt like her throat closed up when she took it. Currently does not take any medications for rescue or prevention. She drinks coffee as needed which does help somewhat.   Headache History: Onset: age 74 Aura: none Associated Symptoms:  Photophobia: yes  Phonophobia: yes  Nausea: yes Vomiting: yes Worse with activity?: yes Duration of headaches: up to 2 weeks  Migraine days per month: 14 Headache free days per month: 16  Current Treatment: Abortive coffee  Preventative none  Prior Therapies                                 ibuprofen Imitrex 50 mg PRN - allergy Zofran 4 mg PRN   LABS: CBC    Component Value Date/Time   WBC 6.3 03/31/2021 0718   RBC 4.59 03/31/2021 0718   HGB 14.7 03/31/2021 0718   HCT 43.2 03/31/2021 0718   PLT 237 03/31/2021 0718   MCV 94.1 03/31/2021 0718   MCH 32.0 03/31/2021 0718   MCHC 34.0 03/31/2021 0718   RDW 11.8 03/31/2021 0718   LYMPHSABS 1.9 11/01/2016 1314   MONOABS 1.6 (H) 11/01/2016 1314   EOSABS 0.0 11/01/2016 1314   BASOSABS 0.0 11/01/2016 1314      Latest Ref Rng & Units 03/31/2021    7:18 AM 05/30/2020   12:07 PM 07/04/2018    2:52 AM  CMP  Glucose 70 - 99 mg/dL 95  105  92   BUN 6 - 20 mg/dL 12  12  <5   Creatinine 0.44 - 1.00 mg/dL 0.75  0.70  0.59   Sodium 135 - 145  mmol/L 139  139  140   Potassium 3.5 - 5.1 mmol/L 3.6  3.9  3.4   Chloride 98 - 111 mmol/L 106  106  113   CO2 22 - 32 mmol/L 24  18  18    Calcium 8.9 - 10.3 mg/dL 9.6  9.8  8.4   Total Protein 6.5 - 8.1 g/dL 8.0  7.9  6.5   Total Bilirubin 0.3 - 1.2 mg/dL 1.6  1.1  0.9   Alkaline Phos 38 - 126 U/L 63  61  50   AST 15 - 41 U/L 14  20  18    ALT 0 - 44 U/L 14  17  16       IMAGING:  MRI brain 12/11/19: Examination limited by significant susceptibility artifact arising from dental braces. Additionally, the patient declined the administration of intravenous contrast and post-contrast imaging.   Within described limitations, there is an unremarkable MRI appearance of the brain. No evidence of acute intracranial abnormality.  Imaging independently reviewed on August 25, 2022   Current Outpatient Medications on File Prior to Visit  Medication Sig Dispense Refill  ondansetron (ZOFRAN ODT) 4 MG disintegrating tablet Take 1 tablet (4 mg total) by mouth every 8 (eight) hours as needed for nausea or vomiting. 30 tablet 2   ibuprofen (ADVIL) 200 MG tablet Take 400 mg by mouth every 6 (six) hours as needed for fever or headache (pain). (Patient not taking: Reported on 06/23/2022)     lidocaine (XYLOCAINE) 2 % solution Use as directed 2 mLs in the mouth or throat as needed for mouth pain. (Patient not taking: Reported on 06/23/2022) 15 mL 0   menthol-cetylpyridinium (CEPACOL) 3 MG lozenge Take 1 lozenge by mouth as needed for sore throat. (Patient not taking: Reported on 06/23/2022)     Phenylephrine-DM-GG-APAP (MUCINEX FAST-MAX CLD FLU THRT) 5-10-200-325 MG TABS Take 2 capsules by mouth 2 (two) times daily as needed (sore throat). (Patient not taking: Reported on 06/23/2022)     No current facility-administered medications on file prior to visit.     Allergies: Allergies  Allergen Reactions   Sumatriptan Anaphylaxis    Family History: Family History  Problem Relation Age of Onset   Cancer  Mother      Past Medical History: Past Medical History:  Diagnosis Date   Migraine    No pertinent past medical history     Past Surgical History Past Surgical History:  Procedure Laterality Date   WISDOM TOOTH EXTRACTION      Social History: Social History   Tobacco Use   Smoking status: Never   Smokeless tobacco: Never  Vaping Use   Vaping Use: Never used  Substance Use Topics   Alcohol use: Never    Alcohol/week: 0.0 standard drinks of alcohol   Drug use: Never    ROS: Negative for fevers, chills. Positive for headaches. All other systems reviewed and negative unless stated otherwise in HPI.   Physical Exam:   Vital Signs: BP 96/65   Pulse 67   Ht 4\' 11"  (1.499 m)   Wt 118 lb (53.5 kg)   BMI 23.83 kg/m  GENERAL: well appearing,in no acute distress,alert SKIN:  Color, texture, turgor normal. No rashes or lesions HEAD:  Normocephalic/atraumatic. CV:  RRR RESP: Normal respiratory effort MSK: no tenderness to palpation over occiput, neck, or shoulders  NEUROLOGICAL: Mental Status: Alert, oriented to person, place and time,Follows commands Cranial Nerves: PERRL, visual fields intact to confrontation, extraocular movements intact, facial sensation intact, no facial droop or ptosis, hearing grossly intact, no dysarthria Motor: muscle strength 5/5 both upper and lower extremities Reflexes: 2+ throughout Sensation: intact to light touch all 4 extremities Coordination: Finger-to- nose-finger intact bilaterally Gait: normal-based   IMPRESSION: 22 year old female who presents for evaluation of migraines. Neurological exam today is normal. Would avoid triptans as Imitrex triggered a possible allergic reaction. Will start Ubrelvy for rescue. She would prefer not to start a preventive medication at this time.  PLAN: -Rescue: Start Ubrelvy 100 mg PRN -Next steps: consider Topamax, amitriptyline for prevention   I spent a total of 23 minutes chart reviewing and  counseling the patient. Headache education was done. Discussed treatment options including preventive and acute medications. Discussed medication side effects, adverse reactions and drug interactions. Written educational materials and patient instructions outlining all of the above were given.  Follow-up: 6 months   Genia Harold, MD 08/25/2022   1:24 PM

## 2022-09-17 ENCOUNTER — Ambulatory Visit (INDEPENDENT_AMBULATORY_CARE_PROVIDER_SITE_OTHER): Payer: Medicaid Other | Admitting: Physician Assistant

## 2022-09-17 ENCOUNTER — Encounter: Payer: Self-pay | Admitting: Physician Assistant

## 2022-09-17 VITALS — BP 100/60 | HR 76 | Ht 58.5 in | Wt 119.0 lb

## 2022-09-17 DIAGNOSIS — R194 Change in bowel habit: Secondary | ICD-10-CM | POA: Diagnosis not present

## 2022-09-17 DIAGNOSIS — K625 Hemorrhage of anus and rectum: Secondary | ICD-10-CM

## 2022-09-17 MED ORDER — NA SULFATE-K SULFATE-MG SULF 17.5-3.13-1.6 GM/177ML PO SOLN: 1.0000 | Freq: Once | ORAL | 0 refills | Status: AC

## 2022-09-17 NOTE — Patient Instructions (Signed)
You have been scheduled for a colonoscopy. Please follow written instructions given to you at your visit today.  Please pick up your prep supplies at the pharmacy within the next 1-3 days. If you use inhalers (even only as needed), please bring them with you on the day of your procedure.  Start a over the counter fiber supplement daily.   The [facility] providers would like to encourage you to use [facility] to communicate with providers for non-urgent requests or questions.  Due to long hold times on the telephone, sending your provider a message by [facility] may be a faster and more efficient way to get a response.  Please allow 48 business hours for a response.  Please remember that this is for non-urgent requests.   Thank you for choosing me and [facility].  Hyacinth Meeker, [place]

## 2022-09-17 NOTE — Progress Notes (Signed)
I agree with the assessment and plan as outlined by Ms. Lemmon. 

## 2022-09-17 NOTE — Progress Notes (Signed)
Chief Complaint:  Rectal Bleeding with alternating bowel habits  HPI:    Ms. Elizabeth Harrington is a 22 year old female with a past medical history as listed below, who was referred to me by Rema Fendt, NP for a complaint of rectal bleeding alternating bowel habits.    06/23/2022 patient saw PCP for rectal bleeding.  At that time discussed bright red blood mixed with the stools that seem like a lot seem to come and go.  At that time referred to [place].    Today, the patient tells me that last year off-and-on she would have rectal bleeding and sometimes it was quite a lot of bright red blood.  This is often mixed with her stool and in the water.  Tells me this happened in January then July then December and again in January and since then she has had nothing.  Tells me she seems to radiate back-and-forth from diarrhea to constipation regardless of what she is eating and sometimes feels a "lump" near her rectum.  Tells me she has lost 7 pounds over the past year or so without really trying.  Describes occasional reflux and generalized abdominal pain at times.    No family history of colon cancer or IBD.  Denies fever, chills, nausea, vomiting, rectal pain or symptoms that awaken her from sleep.  Past Medical History:  Diagnosis Date   Migraine     Past Surgical History:  Procedure Laterality Date   WISDOM TOOTH EXTRACTION      Current Outpatient Medications  Medication Sig Dispense Refill   ondansetron (ZOFRAN ODT) 4 MG disintegrating tablet Take 1 tablet (4 mg total) by mouth every 8 (eight) hours as needed for nausea or vomiting. 30 tablet 2   Ubrogepant (UBRELVY) 100 MG TABS Take 1 tablet (100 mg total) by mouth as needed. May repeat a dose in 2 hours if headache persists. Max dose 2 pills in 24 hours (Patient not taking: Reported on 09/17/2022) 16 tablet 6   No current facility-administered medications for this visit.    Allergies as of 09/17/2022 - Review Complete 09/17/2022  Allergen Reaction  Noted   Sumatriptan Anaphylaxis 06/23/2022    Family History  Problem Relation Age of Onset   Cancer Mother     Social History   Socioeconomic History   Marital status: Single    Spouse name: Not on file   Number of children: Not on file   Years of education: Not on file   Highest education level: Not on file  Occupational History   Not on file  Tobacco Use   Smoking status: Never   Smokeless tobacco: Never  Vaping Use   Vaping Use: Never used  Substance and Sexual Activity   Alcohol use: Never    Alcohol/week: 0.0 standard drinks of alcohol   Drug use: Never   Sexual activity: Yes    Birth control/protection: Condom  Other Topics Concern   Not on file  Social History Narrative   ** Merged History Encounter **       Social Determinants of Health   Financial Resource Strain: Not on file  Food Insecurity: Not on file  Transportation Needs: Not on file  Physical Activity: Unknown (05/05/2020)   Exercise Vital Sign    Days of Exercise per Week: 0 days    Minutes of Exercise per Session: Not on file  Stress: Not on file  Social Connections: Not on file  Intimate Partner Violence: Not on file    Review  of Systems:    Constitutional: No fever or chills Skin: No rash  Cardiovascular: No chest pain   Respiratory: No SOB Gastrointestinal: See HPI and otherwise negative Genitourinary: No dysuria  Neurological: No headache, dizziness or syncope Musculoskeletal: No new muscle or joint pain Hematologic: No bruising Psychiatric: No history of depression or anxiety   Physical Exam:  Vital signs: BP 100/60 (BP Location: Left Arm, Patient Position: Sitting, Cuff Size: Normal)   Pulse 76   Ht 4' 10.5" (1.486 m)   Wt 119 lb (54 kg)   LMP 09/15/2022   BMI 24.45 kg/m    Constitutional:   Pleasant Hispanic female appears to be in NAD, Well developed, Well nourished, alert and cooperative Head:  Normocephalic and atraumatic. Eyes:   PEERL, EOMI. No icterus.  Conjunctiva pink. Ears:  Normal auditory acuity. Neck:  Supple Throat: Oral cavity and pharynx without inflammation, swelling or lesion.  Respiratory: Respirations even and unlabored. Lungs clear to auscultation bilaterally.   No wheezes, crackles, or rhonchi.  Cardiovascular: Normal S1, S2. No MRG. Regular rate and rhythm. No peripheral edema, cyanosis or pallor.  Gastrointestinal:  Soft, nondistended, nontender. No rebound or guarding. Normal bowel sounds. No appreciable masses or hepatomegaly. Rectal: External: Normal, normal sphincter tone; internal: No mass or lesion, no tenderness; anoscopy: Normal Msk:  Symmetrical without gross deformities. Without edema, no deformity or joint abnormality.  Neurologic:  Alert and  oriented x4;  grossly normal neurologically.  Skin:   Dry and intact without significant lesions or rashes. Psychiatric:Demonstrates good judgement and reason without abnormal affect or behaviors.  RELEVANT LABS AND IMAGING: CBC    Component Value Date/Time   WBC 6.3 03/31/2021 0718   RBC 4.59 03/31/2021 0718   HGB 14.7 03/31/2021 0718   HCT 43.2 03/31/2021 0718   PLT 237 03/31/2021 0718   MCV 94.1 03/31/2021 0718   MCH 32.0 03/31/2021 0718   MCHC 34.0 03/31/2021 0718   RDW 11.8 03/31/2021 0718   LYMPHSABS 1.9 11/01/2016 1314   MONOABS 1.6 (H) 11/01/2016 1314   EOSABS 0.0 11/01/2016 1314   BASOSABS 0.0 11/01/2016 1314    CMP     Component Value Date/Time   NA 139 03/31/2021 0718   K 3.6 03/31/2021 0718   CL 106 03/31/2021 0718   CO2 24 03/31/2021 0718   GLUCOSE 95 03/31/2021 0718   BUN 12 03/31/2021 0718   CREATININE 0.75 03/31/2021 0718   CREATININE 0.64 11/13/2015 1134   CALCIUM 9.6 03/31/2021 0718   PROT 8.0 03/31/2021 0718   ALBUMIN 4.4 03/31/2021 0718   AST 14 (L) 03/31/2021 0718   ALT 14 03/31/2021 0718   ALKPHOS 63 03/31/2021 0718   BILITOT 1.6 (H) 03/31/2021 0718   GFRNONAA >60 03/31/2021 0718   GFRAA >60 07/04/2018 0252     Assessment: 1.  Hematochezia: At least 4 separate times over the past year, often quite a bit of blood which last for a few days at a time, not necessarily correlating with days of constipation, rectal exam and anoscopy normal today; consider hemorrhoids which come and go versus IBD versus other 2.  Change in bowel habits: Radiates back-and-forth from diarrhea to constipation, often accompanied by abdominal pain and sometimes blood in her stool as above; consider IBD versus IBS  Plan: 1.  Rectal exam and anoscopy were unrevealing today for cause of bleeding.  Given generalized abdominal pain, altering bowel habits and bleeding recommend a colonoscopy for further evaluation.  Patient was scheduled for colonoscopy in the  [facility] with Dr. Leonides Schanz.  Did provide the patient a detailed list of risks for the procedure and she agrees to proceed. Patient is appropriate for endoscopic procedure(s) in the ambulatory ([facility]) setting.  2.  Patient was advised to start a fiber supplement such as Metamucil, Citrucel or Benefiber. 3.  Patient to follow in clinic per recommendations from Dr. Leonides Schanz after time of procedure.  Hyacinth Meeker, PA-C [facility] 09/17/2022, 2:00 PM  Cc: Rema Fendt, NP

## 2022-10-12 ENCOUNTER — Encounter: Payer: Self-pay | Admitting: Psychiatry

## 2022-10-12 ENCOUNTER — Telehealth: Payer: Self-pay | Admitting: *Deleted

## 2022-10-12 NOTE — Telephone Encounter (Addendum)
Pt said pharmacy told her PA is needed for Ubrelvy 100 mg tablets.  Pt has [place] Emergency planning/management officer card scanned in.  Please route replies to POD 1

## 2022-10-13 ENCOUNTER — Other Ambulatory Visit (HOSPITAL_COMMUNITY): Payer: Self-pay

## 2022-10-13 ENCOUNTER — Telehealth: Payer: Self-pay

## 2022-10-13 NOTE — Telephone Encounter (Signed)
Pharmacy Patient Advocate Encounter   Received notification from [employer] that prior authorization for Ubrelvy 100MG  tablets is required/requested.   PA submitted on 10/13/2022 to (ins) [employer] via CoverMyMeds [employer] or [facility]) confirmation # [id number] Status is pending

## 2022-10-13 NOTE — Telephone Encounter (Signed)
A new telephone call encounter has been made for this PA Request-please see telephone note dated ...10/13/2022 

## 2022-10-14 ENCOUNTER — Other Ambulatory Visit (HOSPITAL_COMMUNITY): Payer: Self-pay

## 2022-10-14 NOTE — Telephone Encounter (Signed)
Phone room: please call pt and let her know PA approved for Ubrelvy. She should now be able to pick up from pharmacy

## 2022-10-14 NOTE — Telephone Encounter (Signed)
Called pt. Informed her of message nurse Emma sent. Pt said thank you so much for calling.  

## 2022-10-14 NOTE — Telephone Encounter (Signed)
Pharmacy Patient Advocate Encounter  Prior Authorization for Ubrelvy 100MG  tablets has been approved by [employer] (ins).    PA #  PA Case ID: [id number]  Effective dates: 10/13/2022 through 10/13/2023  Copay is $4.00

## 2022-10-28 ENCOUNTER — Encounter: Payer: Medicaid Other | Admitting: Internal Medicine

## 2023-08-04 ENCOUNTER — Telehealth: Admitting: Family Medicine

## 2023-08-04 DIAGNOSIS — R6889 Other general symptoms and signs: Secondary | ICD-10-CM

## 2023-08-04 MED ORDER — OSELTAMIVIR PHOSPHATE 75 MG PO CAPS: 75.0000 mg | ORAL_CAPSULE | Freq: Two times a day (BID) | ORAL | 0 refills | Status: AC

## 2023-08-04 NOTE — Progress Notes (Signed)
 E visit for Flu like symptoms   We are sorry that you are not feeling well.  Here is how we plan to help! Based on what you have shared with me it looks like you may have a respiratory virus that may be influenza.  Influenza or "the flu" is   an infection caused by a respiratory virus. The flu virus is highly contagious and persons who did not receive their yearly flu vaccination may "catch" the flu from close contact.  We have anti-viral medications to treat the viruses that cause this infection. They are not a "cure" and only shorten the course of the infection. These prescriptions are most effective when they are given within the first 2 days of "flu" symptoms. Antiviral medication are indicated if you have a high risk of complications from the flu. You should  also consider an antiviral medication if you are in close contact with someone who is at risk. These medications can help patients avoid complications from the flu  but have side effects that you should know. Possible side effects from Tamiflu or oseltamivir include nausea, vomiting, diarrhea, dizziness, headaches, eye redness, sleep problems or other respiratory symptoms. You should not take Tamiflu if you have an allergy to oseltamivir or any to the ingredients in Tamiflu.  Based upon your symptoms and potential risk factors I have prescribed Oseltamivir (Tamiflu).  It has been sent to your designated pharmacy.  You will take one 75 mg capsule orally twice a day for the next 5 days. and I recommend that you follow the flu symptoms recommendation that I have listed below.  ANYONE WHO HAS FLU SYMPTOMS SHOULD: Stay home. The flu is highly contagious and going out or to work exposes others! Be sure to drink plenty of fluids. Water is fine as well as fruit juices, sodas and electrolyte beverages. You may want to stay away from caffeine or alcohol. If you are nauseated, try taking small sips of liquids. How do you know if you are getting enough  fluid? Your urine should be a pale yellow or almost colorless. Get rest. Taking a steamy shower or using a humidifier may help nasal congestion and ease sore throat pain. Using a saline nasal spray works much the same way. Cough drops, hard candies and sore throat lozenges may ease your cough. Line up a caregiver. Have someone check on you regularly.   GET HELP RIGHT AWAY IF: You cannot keep down liquids or your medications. You become short of breath Your fell like you are going to pass out or loose consciousness. Your symptoms persist after you have completed your treatment plan MAKE SURE YOU  Understand these instructions. Will watch your condition. Will get help right away if you are not doing well or get worse.  Your e-visit answers were reviewed by a board certified advanced clinical practitioner to complete your personal care plan.  Depending on the condition, your plan could have included both over the counter or prescription medications.  If there is a problem please reply  once you have received a response from your provider.  Your safety is important to [place].  If you have drug allergies check your prescription carefully.    You can use MyChart to ask questions about today's visit, request a non-urgent call back, or ask for a work or school excuse for 24 hours related to this e-Visit. If it has been greater than 24 hours you will need to follow up with your provider, or enter a  new e-Visit to address those concerns.  You will get an e-mail in the next two days asking about your experience.  I hope that your e-visit has been valuable and will speed your recovery. Thank you for using e-visits.  I provided 5 minutes of non face-to-face time during this encounter for chart review, medication and order placement, as well as and documentation.

## 2023-09-16 ENCOUNTER — Telehealth: Payer: Self-pay

## 2023-09-16 NOTE — Telephone Encounter (Signed)
 Pharmacy Patient Advocate Encounter   Received notification from CoverMyMeds that prior authorization for Ubrelvy  100MG  tablets is due for renewal.   Insurance verification completed.   The patient is insured through [facility] MEDICAID.  Action: Patient hasn't been seen in your office in over a year. Plan requires updated chart notes for PA renewal.

## 2024-06-20 ENCOUNTER — Ambulatory Visit

## 2024-07-04 ENCOUNTER — Encounter
